# Patient Record
Sex: Female | Born: 1975 | Race: Black or African American | Hispanic: No | Marital: Married | Smoking: Never smoker
Health system: Southern US, Community
[De-identification: ages and names within clinical notes are randomized; demographics above are authoritative.]

## PROBLEM LIST (undated history)

## (undated) DIAGNOSIS — C801 Malignant (primary) neoplasm, unspecified: Secondary | ICD-10-CM

## (undated) DIAGNOSIS — Z8041 Family history of malignant neoplasm of ovary: Secondary | ICD-10-CM

## (undated) DIAGNOSIS — E119 Type 2 diabetes mellitus without complications: Secondary | ICD-10-CM

## (undated) DIAGNOSIS — J302 Other seasonal allergic rhinitis: Secondary | ICD-10-CM

## (undated) DIAGNOSIS — F419 Anxiety disorder, unspecified: Secondary | ICD-10-CM

## (undated) HISTORY — PX: TUBAL LIGATION: SHX77

## (undated) HISTORY — PX: COLPOSCOPY: SHX161

## (undated) HISTORY — DX: Type 2 diabetes mellitus without complications: E11.9

## (undated) HISTORY — DX: Family history of malignant neoplasm of ovary: Z80.41

---

## 1998-04-11 ENCOUNTER — Encounter: Admission: RE | Admit: 1998-04-11 | Discharge: 1998-04-11 | Payer: Self-pay | Admitting: *Deleted

## 1998-08-20 ENCOUNTER — Other Ambulatory Visit: Admission: RE | Admit: 1998-08-20 | Discharge: 1998-08-20 | Payer: Self-pay | Admitting: Gynecology

## 1999-06-08 ENCOUNTER — Emergency Department (HOSPITAL_COMMUNITY): Admission: EM | Admit: 1999-06-08 | Discharge: 1999-06-08 | Payer: Self-pay | Admitting: Emergency Medicine

## 1999-09-02 ENCOUNTER — Other Ambulatory Visit: Admission: RE | Admit: 1999-09-02 | Discharge: 1999-09-02 | Payer: Self-pay | Admitting: Gynecology

## 2000-09-20 ENCOUNTER — Other Ambulatory Visit: Admission: RE | Admit: 2000-09-20 | Discharge: 2000-09-20 | Payer: Self-pay | Admitting: Gynecology

## 2001-10-13 ENCOUNTER — Other Ambulatory Visit: Admission: RE | Admit: 2001-10-13 | Discharge: 2001-10-13 | Payer: Self-pay | Admitting: Gynecology

## 2002-10-17 ENCOUNTER — Other Ambulatory Visit: Admission: RE | Admit: 2002-10-17 | Discharge: 2002-10-17 | Payer: Self-pay | Admitting: Gynecology

## 2004-01-03 ENCOUNTER — Other Ambulatory Visit: Admission: RE | Admit: 2004-01-03 | Discharge: 2004-01-03 | Payer: Self-pay | Admitting: Gynecology

## 2004-08-17 HISTORY — PX: HEMATOMA EVACUATION: SHX5118

## 2004-08-22 ENCOUNTER — Encounter: Admission: RE | Admit: 2004-08-22 | Discharge: 2004-08-22 | Payer: Self-pay | Admitting: General Surgery

## 2004-09-11 ENCOUNTER — Encounter (INDEPENDENT_AMBULATORY_CARE_PROVIDER_SITE_OTHER): Payer: Self-pay | Admitting: *Deleted

## 2004-09-11 ENCOUNTER — Ambulatory Visit (HOSPITAL_BASED_OUTPATIENT_CLINIC_OR_DEPARTMENT_OTHER): Admission: RE | Admit: 2004-09-11 | Discharge: 2004-09-11 | Payer: Self-pay | Admitting: General Surgery

## 2005-01-15 ENCOUNTER — Other Ambulatory Visit: Admission: RE | Admit: 2005-01-15 | Discharge: 2005-01-15 | Payer: Self-pay | Admitting: Gynecology

## 2005-06-05 ENCOUNTER — Other Ambulatory Visit: Admission: RE | Admit: 2005-06-05 | Discharge: 2005-06-05 | Payer: Self-pay | Admitting: Gynecology

## 2005-07-10 ENCOUNTER — Inpatient Hospital Stay (HOSPITAL_COMMUNITY): Admission: AD | Admit: 2005-07-10 | Discharge: 2005-07-10 | Payer: Self-pay | Admitting: Gynecology

## 2005-10-12 ENCOUNTER — Encounter: Admission: RE | Admit: 2005-10-12 | Discharge: 2005-10-12 | Payer: Self-pay | Admitting: Gynecology

## 2005-11-23 ENCOUNTER — Ambulatory Visit: Payer: Self-pay | Admitting: Obstetrics and Gynecology

## 2005-11-23 ENCOUNTER — Ambulatory Visit (HOSPITAL_COMMUNITY): Admission: RE | Admit: 2005-11-23 | Discharge: 2005-11-23 | Payer: Self-pay | Admitting: Gynecology

## 2005-12-11 ENCOUNTER — Inpatient Hospital Stay (HOSPITAL_COMMUNITY): Admission: AD | Admit: 2005-12-11 | Discharge: 2005-12-14 | Payer: Self-pay | Admitting: Gynecology

## 2005-12-11 ENCOUNTER — Encounter (INDEPENDENT_AMBULATORY_CARE_PROVIDER_SITE_OTHER): Payer: Self-pay | Admitting: Specialist

## 2005-12-17 ENCOUNTER — Inpatient Hospital Stay (HOSPITAL_COMMUNITY): Admission: AD | Admit: 2005-12-17 | Discharge: 2005-12-19 | Payer: Self-pay | Admitting: Gynecology

## 2006-01-01 ENCOUNTER — Ambulatory Visit: Admission: RE | Admit: 2006-01-01 | Discharge: 2006-01-01 | Payer: Self-pay | Admitting: Gynecology

## 2006-01-07 ENCOUNTER — Other Ambulatory Visit: Admission: RE | Admit: 2006-01-07 | Discharge: 2006-01-07 | Payer: Self-pay | Admitting: Gynecology

## 2007-02-16 ENCOUNTER — Other Ambulatory Visit: Admission: RE | Admit: 2007-02-16 | Discharge: 2007-02-16 | Payer: Self-pay | Admitting: Gynecology

## 2007-03-20 ENCOUNTER — Emergency Department (HOSPITAL_COMMUNITY): Admission: EM | Admit: 2007-03-20 | Discharge: 2007-03-20 | Payer: Self-pay | Admitting: *Deleted

## 2008-02-22 ENCOUNTER — Other Ambulatory Visit: Admission: RE | Admit: 2008-02-22 | Discharge: 2008-02-22 | Payer: Self-pay | Admitting: Gynecology

## 2009-03-15 ENCOUNTER — Other Ambulatory Visit: Admission: RE | Admit: 2009-03-15 | Discharge: 2009-03-15 | Payer: Self-pay | Admitting: Obstetrics and Gynecology

## 2009-03-15 ENCOUNTER — Encounter: Payer: Self-pay | Admitting: Women's Health

## 2009-03-15 ENCOUNTER — Ambulatory Visit: Payer: Self-pay | Admitting: Women's Health

## 2009-04-02 ENCOUNTER — Ambulatory Visit: Payer: Self-pay | Admitting: Gynecology

## 2009-07-10 ENCOUNTER — Other Ambulatory Visit: Admission: RE | Admit: 2009-07-10 | Discharge: 2009-07-10 | Payer: Self-pay | Admitting: Gynecology

## 2009-07-10 ENCOUNTER — Ambulatory Visit: Payer: Self-pay | Admitting: Gynecology

## 2009-07-18 ENCOUNTER — Ambulatory Visit: Payer: Self-pay | Admitting: Gynecology

## 2009-10-21 ENCOUNTER — Ambulatory Visit: Payer: Self-pay | Admitting: Gynecology

## 2009-10-21 ENCOUNTER — Other Ambulatory Visit: Admission: RE | Admit: 2009-10-21 | Discharge: 2009-10-21 | Payer: Self-pay | Admitting: Gynecology

## 2009-12-24 ENCOUNTER — Ambulatory Visit: Payer: Self-pay | Admitting: Gynecology

## 2009-12-27 ENCOUNTER — Ambulatory Visit: Payer: Self-pay | Admitting: Gynecology

## 2010-01-06 ENCOUNTER — Ambulatory Visit: Payer: Self-pay | Admitting: Gynecology

## 2010-07-08 ENCOUNTER — Inpatient Hospital Stay (HOSPITAL_COMMUNITY)
Admission: AD | Admit: 2010-07-08 | Discharge: 2010-07-08 | Payer: Self-pay | Source: Home / Self Care | Admitting: Obstetrics & Gynecology

## 2010-08-27 ENCOUNTER — Encounter (INDEPENDENT_AMBULATORY_CARE_PROVIDER_SITE_OTHER): Payer: Self-pay | Admitting: Obstetrics and Gynecology

## 2010-08-27 ENCOUNTER — Inpatient Hospital Stay (HOSPITAL_COMMUNITY)
Admission: RE | Admit: 2010-08-27 | Discharge: 2010-08-29 | Payer: Self-pay | Source: Home / Self Care | Attending: Obstetrics and Gynecology | Admitting: Obstetrics and Gynecology

## 2010-09-01 LAB — COMPREHENSIVE METABOLIC PANEL
ALT: 16 U/L (ref 0–35)
AST: 21 U/L (ref 0–37)
Albumin: 2.6 g/dL — ABNORMAL LOW (ref 3.5–5.2)
Alkaline Phosphatase: 126 U/L — ABNORMAL HIGH (ref 39–117)
BUN: 3 mg/dL — ABNORMAL LOW (ref 6–23)
CO2: 24 mEq/L (ref 19–32)
Calcium: 8.5 mg/dL (ref 8.4–10.5)
Chloride: 105 mEq/L (ref 96–112)
Creatinine, Ser: 0.69 mg/dL (ref 0.4–1.2)
GFR calc Af Amer: >60 mL/min (ref >60)
GFR calc non Af Amer: >60 mL/min (ref >60)
Glucose, Bld: 82 mg/dL (ref 70–99)
Potassium: 3.8 mEq/L (ref 3.5–5.1)
Sodium: 136 mEq/L (ref 135–145)
Total Bilirubin: 0.4 mg/dL (ref 0.3–1.2)
Total Protein: 5.4 g/dL — ABNORMAL LOW (ref 6.0–8.3)

## 2010-09-01 LAB — CBC
HCT: 41.2 % (ref 36.0–46.0)
HCT: 33.7 % — ABNORMAL LOW (ref 36.0–46.0)
HCT: 31 % — ABNORMAL LOW (ref 36.0–46.0)
Hemoglobin: 13.8 g/dL (ref 12.0–15.0)
Hemoglobin: 11.1 g/dL — ABNORMAL LOW (ref 12.0–15.0)
Hemoglobin: 10.2 g/dL — ABNORMAL LOW (ref 12.0–15.0)
MCH: 28.2 pg (ref 26.0–34.0)
MCH: 27.2 pg (ref 26.0–34.0)
MCH: 27.3 pg (ref 26.0–34.0)
MCHC: 33.5 g/dL (ref 30.0–36.0)
MCHC: 32.9 g/dL (ref 30.0–36.0)
MCHC: 32.9 g/dL (ref 30.0–36.0)
MCV: 84.1 fL (ref 78.0–100.0)
MCV: 82.6 fL (ref 78.0–100.0)
MCV: 83.1 fL (ref 78.0–100.0)
Platelets: 199 10*3/uL (ref 150–400)
Platelets: 191 10*3/uL (ref 150–400)
Platelets: 194 10*3/uL (ref 150–400)
RBC: 4.9 MIL/uL (ref 3.87–5.11)
RBC: 4.08 MIL/uL (ref 3.87–5.11)
RBC: 3.73 MIL/uL — ABNORMAL LOW (ref 3.87–5.11)
RDW: 13.9 % (ref 11.5–15.5)
RDW: 13.7 % (ref 11.5–15.5)
RDW: 14.1 % (ref 11.5–15.5)
WBC: 9.8 10*3/uL (ref 4.0–10.5)
WBC: 12.4 10*3/uL — ABNORMAL HIGH (ref 4.0–10.5)
WBC: 10.6 10*3/uL — ABNORMAL HIGH (ref 4.0–10.5)

## 2010-09-01 LAB — RPR: RPR Ser Ql: NONREACTIVE

## 2010-09-01 LAB — SURGICAL PCR SCREEN
MRSA, PCR: NEGATIVE
Staphylococcus aureus: NEGATIVE

## 2010-09-01 LAB — URIC ACID: Uric Acid, Serum: 5.6 mg/dL (ref 2.4–7.0)

## 2010-09-01 LAB — LACTATE DEHYDROGENASE: LDH: 134 U/L (ref 94–250)

## 2010-09-17 NOTE — Discharge Summary (Signed)
[name], [name]                 ACCOUNT NO.:  [id number]  MEDICAL RECORD NO.:  [id number]          PATIENT TYPE:  INP  LOCATION:  9130                          [facility]ACILITY:  [facility]  PHYSICIAN:  Zelphia Cairo, MD    DATE OF BIRTH:  [birth year]  DATE OF ADMISSION:  08/27/2010 DATE OF DISCHARGE:  08/29/2010                              DISCHARGE SUMMARY   ADMITTING DIAGNOSES: 1. Intrauterine pregnancy at term. 2. Previous cesarean section, desires repeat. 3. Multiparity, desires permanent sterilization.  DISCHARGE DIAGNOSES: 1. Status post low transverse cesarean section. 2. Viable female infant.  PROCEDURE: 1. Repeat low transverse cesarean section.            2. Bilateral tubal ligation  REASON FOR ADMISSION:  Please see dictated H and P.  HOSPITAL COURSE:  The patient is a 35 year old African American married female gravida 2, para 1, that was admitted to [facility] for scheduled cesarean section.  The patient had had a previous cesarean delivery, desired repeat.  Due to multiparity, patient also requested permanent sterilization.  On the morning of admission, patient was taken to operating room where spinal anesthesia was administered without difficulty.  Low transverse incision was made with delivery of viable female infant weighing 8 pounds 11 ounces with Apgars of 9 at 1 minute and 9 at 5 minutes.  The patient tolerated procedure, was taken to the recovery room in stable condition.  On postoperative day #1, the patient was without complaint.  Vital signs were stable.  Blood pressure was noted to be elevated at 120/81 and 136/90.  The patient did have a history of previous PIH with first pregnancy.  Abdomen soft with good return of bowel function.  Fundus was firm and nontender.  Abdominal dressing noted be clean, dry, and intact.  Deep tendon reflexes were 1+. No clonus.  Foley had been discontinued.  She is voiding well.  Laboratory findings revealed  hemoglobin of 11.1 and platelet count of 191,000.  Blood type is known to be O positive.  PIH labs were drawn for the following morning.  On postoperative day #2, the patient was without complaint.  She did desire early discharge.  Vital signs were stable. Blood pressures were 110 over 73 to 113 over 74.  Abdomen soft.  Fundus firm and nontender.  Incision was clean, dry, and intact.  Laboratory findings reveled hemoglobin stable at 10.2, platelet count 194,000. Liver function tests revealed AST of 21, ALT of 16.  DISCHARGE INSTRUCTIONS:  Reviewed and patient was later discharged home.  CONDITION ON DISCHARGE:  Stable.  DIET:  Regular as tolerated.  ACTIVITY:  No heavy lifting, no driving x2 weeks, no vaginal entry.  FOLLOWUP:  Patient to follow up in the office in 1 week for an incision check and blood pressure check.  She is to call for temperature greater than 100 degrees, persistent nausea, vomiting, heavy vaginal bleeding, and/or redness or drainage from the incisional site.  The patient was also encouraged to call for headache, blurred vision, right upper quadrant pain.  DISCHARGE MEDICATIONS: 1. Tylox #30 one p.o. every 4-6 hours p.r.n. 2.  Motrin 600 mg every 6 hours. 3. Prenatal vitamins one p.o. daily.     Julio Sicks, N.P.   ______________________________ Zelphia Cairo, MD    CC/MEDQ  D:  08/29/2010  T:  08/29/2010  Job:  [id number]  Electronically Signed by Julio Sicks N.P. on 09/01/2010 08:37:15 AM Electronically Signed by Zelphia Cairo MD on 09/17/2010 08:11:35 PM

## 2010-09-19 NOTE — H&P (Signed)
  NAMEJAILYNN, [name] NO.:  [id number]  MEDICAL RECORD NO.:  [id number]         PATIENT TYPE:  WINP  LOCATION:  130                           [facility]ILITY:  [facility]  PHYSICIAN:  Juluis Mire, M.D.   DATE OF BIRTH:  May 10, 1976  DATE OF ADMISSION:  08/27/2010 DATE OF DISCHARGE:                             HISTORY & PHYSICAL   HISTORY OF PRESENT ILLNESS:  The patient is a 34 year old gravida 2, para 1, abortus 0, estimated date of confinement of August 26, 2010, by ultrasound evaluation given estimated gestational age of [redacted] weeks. Prenatal course has been complicated by prior cesarean section done for induction secondary to pregnancy-induced hypertension.  She had desires of an attempt at vaginal delivery.  However, we are at term with a long closed cervix and some elevated blood pressure.  In view of this, she decided to proceed with repeat cesarean section and desires a permanent sterilization.  Her last pregnancy was also complicated by gestational diabetes requiring insulin this pregnancy.  Her glucose screen has been negative.  In terms of allergies, no known drug allergies.  MEDICATIONS:  Prenatal vitamin, uses albuterol inhaler.  For past medical history, family history, and social history, please see prenatal records.  REVIEW OF SYSTEMS:  Noncontributory.  PHYSICAL EXAMINATION:  VITAL SIGNS:  Blood pressure 130/90.  Other vital signs are stable. HEENT:  The patient is normocephalic.  Pupils equal, round, reactive to light and accommodation.  Extraocular movements are intact.  Sclerae and conjunctivae are clear.  Oropharynx clear. NECK:  Without thyromegaly. BREASTS:  Not examined. LUNGS:  Clear. CARDIOVASCULAR:  Regular rhythm and rate without murmurs or gallops. ABDOMEN:  Gravid uterus consistent with dates. PELVIC:  The cervix is extremely long, closed.  Vertex pleasant. EXTREMITIES:  Trace edema. NEUROLOGICAL:  Grossly within normal  limits.  IMPRESSION: 1. Intrauterine pregnancy at term with unfavorable cervix. 2. Prior cesarean section, now desires a repeat. 3. Multiparity, desires sterility. 4. Asthma. 5. Some mild elevation of blood pressure plan and management.  The     patient to undergo repeat cesarean section with bilateral tubal     ligation.  The potential irreversibility of sterilization is     discussed.  Failure rates of 1 in 200 cases.  Failures can be in     the form of ectopic pregnancy requiring further surgical     management.  Risk of cesarean section explained, including the risk     of infection.  Risk of hemorrhage that could require transfusion,     risk of AIDS, or hepatitis.  Excessive bleeding could require     hysterectomy.  Risk of injury to adjacent organs including bladder,     bowel, ureters that could require further exploratory surgery.     Risk of deep venous thrombosis and pulmonary embolus.  The patient     does understand potential risks and complications.     Juluis Mire, M.D.     JSM/MEDQ  D:  08/27/2010  T:  08/27/2010  Job:  [id number]  Electronically Signed by Richardean Chimera M.D. on 09/19/2010 01:35:04 PM

## 2010-09-19 NOTE — Op Note (Signed)
[name], [name]                 ACCOUNT NO.:  [id number]  MEDICAL RECORD NO.:  [id number]          PATIENT TYPE:  INP  LOCATION:  9130                          [facility]ILITY[facility]  [facility]  PHYSICIAN:  Juluis Mire, M.D.   DATE OF BIRTH:  24-Jul-1976  DATE OF PROCEDURE:  08/27/2010 DATE OF DISCHARGE:                              OPERATIVE REPORT   PREOPERATIVE DIAGNOSES: 1. Intrauterine pregnancy at term with prior cesarean section, desires     a repeat. 2. Multiparity, desires sterility.  POSTOPERATIVE DIAGNOSES: 1. Intrauterine pregnancy at term with prior cesarean section, desires     a repeat. 2. Multiparity, desires sterility.  PROCEDURE:  Repeat low transverse cesarean section with bilateral tubal ligation.  SURGEON:  Juluis Mire, MD  ANESTHESIA:  Spinal.  ESTIMATED BLOOD LOSS:  800 mL.  PACKS AND DRAINS:  None.  INTRAOPERATIVE BLOOD PLACED:  None.  COMPLICATIONS:  None.  INDICATIONS:  Are dictated in history and physical. PROCEDURE IN DETAIL:  The patient taken to OR, placed in supine position with left lateral tilt.  After satisfactory level of spinal anesthesia obtained, the abdomen was prepped out with Betadine and draped in sterile field.  A low transverse skin incision was made and the previous incision was excised.  Excision was extended through subcutaneous tissue.  Fascia was entered sharply and the incision in the fascia was extended laterally.  Fascia taken off the muscle superiorly and inferiorly.  Rectus muscles were separated in midline.  Peritoneum was entered sharply.  Incision of the peritoneum extended both superiorly and inferiorly.  Low transverse bladder flap was developed.  Low transverse uterine incision was begun with knife, extended laterally using manual traction.  The infant presented in vertex presentation, delivered with elevation of head and fundal pressure.  The infant was a viable female weighing 8 pounds and 11 ounces. Apgars were 9/9.   Umbilical artery pH was 7.32.  Placenta was then delivered manually.  Uterus exteriorized for closure.  Uterus closed with interlocking suture of 0 chromic using a two-layer closure technique.  Tubes and ovaries unremarkable.  Midsegment of each tube was elevated with a Babcock tenaculum.  A hole was made in the avascular of the mesosalpinx.  Individual ligatures of 0 plain catgut were used to ligate off the segment and tube.  The intervening segment tube was then excised.  Cut ends of the tubes were cauterized using the Bovie.  Tubes were sent for Pathology.  Uterus was returned to the abdominal cavity.  We had some oozing from the lower uterine segment, brought under control figure-of-eight of 0 chromic. With this, we had good hemostasis and clear urine output.  We irrigated the pelvis.  We again had good clear urine output and no active bleeding.  Muscles and peritoneum closed with a suture of 3-0 Vicryl. Fascia closed with a suture of 0 PDS.  Adipose level layer was closed with interrupted sutures of 0 plain catgut.  Skin was closed with a running subcuticular 4-0 Monocryl.  Steri-Strips were applied.  At this point in time, sponge instrument and needle count was correct by circulating nurse  x2.  Foley catheter remained clear at the time of closure.  The patient tolerated procedure well was returned to recovery room in good condition.     Juluis Mire, M.D.     JSM/MEDQ  D:  08/27/2010  T:  08/28/2010  Job:  [id number]  Electronically Signed by Richardean Chimera M.D. on 09/19/2010 01:35:08 PM

## 2010-10-28 LAB — URINALYSIS, ROUTINE W REFLEX MICROSCOPIC
Bilirubin Urine: NEGATIVE
Glucose, UA: NEGATIVE mg/dL
Hgb urine dipstick: NEGATIVE
Ketones, ur: NEGATIVE mg/dL
Nitrite: NEGATIVE
Protein, ur: NEGATIVE mg/dL
Specific Gravity, Urine: 1.015 (ref 1.005–1.030)
Urobilinogen, UA: 0.2 mg/dL (ref 0.0–1.0)
pH: 6.5 (ref 5.0–8.0)

## 2010-10-28 LAB — COMPREHENSIVE METABOLIC PANEL
ALT: 13 U/L (ref 0–35)
AST: 16 U/L (ref 0–37)
Albumin: 3 g/dL — ABNORMAL LOW (ref 3.5–5.2)
Alkaline Phosphatase: 118 U/L — ABNORMAL HIGH (ref 39–117)
BUN: 6 mg/dL (ref 6–23)
CO2: 23 mEq/L (ref 19–32)
Calcium: 9.4 mg/dL (ref 8.4–10.5)
Chloride: 104 mEq/L (ref 96–112)
Creatinine, Ser: 0.66 mg/dL (ref 0.4–1.2)
GFR calc Af Amer: >60 mL/min (ref >60)
GFR calc non Af Amer: >60 mL/min (ref >60)
Glucose, Bld: 113 mg/dL — ABNORMAL HIGH (ref 70–99)
Potassium: 3.9 mEq/L (ref 3.5–5.1)
Sodium: 135 mEq/L (ref 135–145)
Total Bilirubin: 0.6 mg/dL (ref 0.3–1.2)
Total Protein: 6.4 g/dL (ref 6.0–8.3)

## 2010-10-28 LAB — CBC
HCT: 37.5 % (ref 36.0–46.0)
Hemoglobin: 12.4 g/dL (ref 12.0–15.0)
MCH: 28.4 pg (ref 26.0–34.0)
MCHC: 33 g/dL (ref 30.0–36.0)
MCV: 86.1 fL (ref 78.0–100.0)
Platelets: 235 10*3/uL (ref 150–400)
RBC: 4.35 MIL/uL (ref 3.87–5.11)
RDW: 13.8 % (ref 11.5–15.5)
WBC: 11.8 10*3/uL — ABNORMAL HIGH (ref 4.0–10.5)

## 2010-10-28 LAB — URIC ACID: Uric Acid, Serum: 4.7 mg/dL (ref 2.4–7.0)

## 2011-01-02 NOTE — Op Note (Signed)
NAMEDERRISHA, FOOS                 ACCOUNT NO.:  [id number]   MEDICAL RECORD NO.:  [id number]          PATIENT TYPE:  AMB   LOCATION:  DSC                          FACILITY:  [facility]   PHYSICIAN:  Anselm Pancoast. 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  This is following a fall last summer.   OPERATION:  Excision of large area, about a 4-cm mass, probably fat  necrosis.   ANESTHESIA:  General.   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  This improved but was  black and blue for several weeks.  The area, however, has persisted with a  mass in the subcutaneous area that measured greater than 4 inches in size.  I saw her after she was referred from a [facility].  An x-ray of the bone had  been obtained, and this was unremarkable.  I saw her in the office, and on  exam she had a definite firm mass that was in the fatty tissue that  certainly was abnormal and probably was fat necrosis.  It measured about 4 x  5 inches in greatest size.  I discussed with her that I would think we ought  to do a CT and possibly do a core biopsy.  If it was benign, then we could  excise the area.   She had the CT, and that was done about 2 weeks later.  The CT shows the  mass in the subcutaneous tissue and is thought most likely fat necrosis.  I  saw her back in the office, and the area had decreased in size but still was  a significant firm mass, and she wanted to proceed on with excision of the  area.  She was not interested in the core biopsy, and I did not think it was  necessary since the mass was decreased with history of trauma, and the x-ray  was consistent with fat  necrosis.   The patient was scheduled for today and preoperatively was given Kefzol 1 g  and then was taken to the operative suite.  The mass had been marked, and  she was positioned on the OR table with induction of general anesthesia, and  then the left hip was placed over a small pad to elevate her, and then we  flexed the knee, and held this in place with a pillow so that it would  expose the area so I could go a little more posterior since it is on the  lateral aspect of the thigh.  The area was prepped with Betadine solution  and draped in a sterile manner.  I had marked the area circumferentially,  and I made a transverse incision since the mass was more transverse and then  elevated the skin which was  very adherent to the underlying mass right at  the center area, and this looks grossly like fat necrosis to me.  We then  worked circumferentially going gradually deeper and then staying close to  the mass but completely excising it.  After we were nearly down to the  fascia, we could go medially, and then the mass was completely excised.  There were numerous little blood vessels that required suturing with 4-0  Vicryl, and we were careful with the hemostasis.  Numerous little capillary  areas were coagulated.  Anything that was an identifiable vessel was sutured  with a 4-0 Vicryl, and then I sent the mass over for pathology to examine  it.  They did a frozen, and they think it is fat necrosis but said it has  more neovascularity than they can be sure, and they want to wait until the  permanents before making a definite call.  The little giblets of fatty  tissue that were excised after the main mass were sent on up for permanent  exam also.   The skin and subcutaneous tissue will not come together because of the size  of the defect, and I elected to basically close with some subcuticular 4-0  Vicryl in mattress alternating with simple sutures on the skin, and I did  put a 10 Blake  drain in the wound and brought it out inferolaterally.  The  skin sits down nicely, and then the Vaseline gauze was placed over this, and  the drain had been sutured to the skin.  I will keep the drain in until  early next week and then remove it in the office.  The patient tolerated the  procedure nicely and was extubated and sent to the recovery room in a stable  postoperative condition.      WJW/MEDQ  D:  09/11/2004  T:  09/11/2004  Job:  [id number]

## 2011-01-02 NOTE — Discharge Summary (Signed)
NAMEELDINE, RENCHER                 ACCOUNT NO.:  [id number]   MEDICAL RECORD NO.:  [id number]          PATIENT TYPE:  INP   LOCATION:                                [facility]   PHYSICIAN:  Timothy P. Fontaine, M.D.DATE OF BIRTH:  12/17/1975   DATE OF ADMISSION:  12/11/2005  DATE OF DISCHARGE:  12/14/2005                                 DISCHARGE SUMMARY   DISCHARGE DIAGNOSES:  1.  Pregnancy at 38 weeks.  2.  Gestational diabetes, insulin-requiring.  3.  Preeclampsia.  4.  Unfavorable cervix.   PROCEDURE:  Primary low transverse cesarean section December 11, 2005, Dr.  Colin Broach.   HOSPITAL COURSE:  35 year old G1, P0 female [redacted] weeks gestation followed for  gestational diabetes.  Evaluation in the office revealed elevated blood  pressures in the 150-160/90-100 range and proteinuria.  She was at 3+  pretibial edema, normal reflexes.  She was without signs or symptoms such as  headache, visual changes, epigastric pain.  She initially was admitted for  blood pressure rechecks and PIH laboratories.  Her PIH laboratories returned  normal but serial blood pressures remained elevated.  Her cervix was long,  closed, high, ballottable, unengaged.  The options for the situation were  reviewed with her and we elected for a primary cesarean section.  The  patient underwent an uncomplicated primary cesarean section April 27  producing a normal female infant, Apgars 8 and 9, weight 6 pounds 6 ounces.  Patient was begun on magnesium sulfate prophylaxis postpartum and initially  continued with elevated blood pressures in the 140s-160s/90s.  Patient  subsequently began diuresing spontaneously.  She was begun on low-dose  labetalol 100 mg b.i.d. and subsequently did well having her magnesium  discontinued after 36 hours.  Her blood pressures after initiation of her  labetalol were in the 130-140/80-90 range.  Her postoperative hemoglobin was  11.3.  Her blood type is O+.  Her rubella titer is  positive.  The patient  desired discharge postoperative day #3.  She was ambulating well, tolerating  a regular diet, voiding without difficulty, and having had a bowel movement.  The patient was given a prescription for Tylox #30 one to two p.o. q.4-6h.,  labetalol 100 mg b.i.d. to be continued postpartum.  She was instructed for  signs and symptoms of worsening blood pressure such as headaches, visual  changes, epigastric pain for ASAP calls.  She also was instructed to follow  her blood pressure serially at home and she agreed to arrange for this.  Patient will be seen in four weeks following discharge, sooner as needed.      Timothy P. Fontaine, M.D.  Electronically Signed     TPF/MEDQ  D:  12/14/2005  T:  12/14/2005  Job:  [id number]

## 2011-01-02 NOTE — H&P (Signed)
[name], [name]                 ACCOUNT NO.:  [id number]   MEDICAL RECORD NO.:  [id number]          PATIENT TYPE:  INP   LOCATION:  9198                          [facility]   PHYSICIAN:  Timothy P. Fontaine, M.D.DATE OF BIRTH:  Sep 11, 1975   DATE OF ADMISSION:  12/11/2005  DATE OF DISCHARGE:                                HISTORY & PHYSICAL   CHIEF COMPLAINT:  1.  Pregnancy at [redacted] weeks gestation.  2.  Gestational diabetes, insulin-requiring.  3.  Preeclampsia.  4.  Unfavorable cervix.   HISTORY OF PRESENT ILLNESS:  This 35 year old G1, T0 female at [redacted] weeks  gestation being followed for gestational diabetes requiring insulin in the  p.m., was evaluated in the office, found to have elevated blood pressures in  the 130 to 140/90 range and 3 to 4+ proteinuria.  She was sent to maternity  admission triage area for continued surveillance and PIH labs.  Her medical  history again is significant for gestation diabetes requiring insulin in the  p.m. with good glucose control.   ALLERGIES:  Tobramycin, dexamethasone eye drops.   REVIEW OF SYSTEMS:  Noncontributory.   FAMILY HISTORY:  Noncontributory.   SOCIAL HISTORY:  Noncontributory.   PHYSICAL EXAMINATION:  VITAL SIGNS:  Blood pressure serial checks 157/108,  161/109, 164/98, 165/111, afebrile. Vital signs otherwise stable, heart rate  of 83, respirations 20.  HEENT:  Normal.  LUNGS:  Clear.  CARDIAC:  Regular rate, no rubs, murmurs or  gallops.  ABDOMEN:  Gravid.  Fundus consistent with term.  External monitor shows  reactive  fetal tracing without regular contractions.  PELVIC EXAM:  Cervix is long, closed, high, difficult to reach.  EXTREMITIES: There is 2 to 3+ pitting lower tibial edema.  Reflexes are 1 to  2+, negative clonus.   ASSESSMENT AND PLAN:  This is a 35 year old G1, P0, 38 weeks followed for  gestational diabetes with PIH over the latter part of her pregnancy with  blood pressures in the 130/90 range now  with preeclampsia as evidenced by  proteinuria and elevated blood pressures.  Ultrasound in the office today  showed a normal AFI with an estimated fetal weight approximately three week  lag behind gestational age.  Cervix is extremely unfavorable.  Options and  issues were reviewed with the patient and her husband to include  observation, continuing monitoring, magnesium sulfate, attempt at serial  induction versus primary cesarean section.  Given the total picture, I think  the most prudent course is to proceed with cesarean section and I reviewed  with the patient what is involved with cesarean section to include the risk  of bleeding, transfusion, infection, wound complications requiring opening  and draining of incisions, closure with secondary intention.  The  possibilities for prolonged antibiotics, reoperation for abscess drainage as  well as the risk of inadvertant injury to internal organs including bowel,  bladder, ureters, vessels and nerves necessitating major exploratory  reparative surgeries, and future reparative surgeries including ostomy  formation.  The risk of fetal injury during the birthing process,  musculoskeletal, scalpel, neuro injuries were all  discussed, understood and  accepted.  The patient's questions were answered very satisfactorily and she  is ready to proceed with surgery.      Timothy P. Fontaine, M.D.  Electronically Signed     TPF/MEDQ  D:  12/11/2005  T:  12/11/2005  Job:  [id number]

## 2011-01-02 NOTE — Op Note (Signed)
[name], [name]                 ACCOUNT NO.:  [id number]   MEDICAL RECORD NO.:  [id number]          PATIENT TYPE:  INP   LOCATION:  9149                          [facility]   PHYSICIAN:  Timothy P. Fontaine, M.D.DATE OF BIRTH:  [birth year]   DATE OF PROCEDURE:  12/11/2005  DATE OF DISCHARGE:                                 OPERATIVE REPORT   PREOPERATIVE DIAGNOSES:  1.  Intrauterine pregnancy at 38 weeks.  2.  Gestational diabetes, insulin-dependent.  3.  Preeclampsia.  4.  Unfavorable cervix.   POSTOPERATIVE DIAGNOSES:  1.  Intrauterine pregnancy at 38 weeks.  2.  Gestational diabetes, insulin-dependent.  3.  Preeclampsia.  4.  Unfavorable cervix.   PROCEDURE:  Primary low transverse cervical Cesarean section.   SURGEON:  Timothy P. Fontaine, M.D.   ASSISTANT:  Scrub technician.   ANESTHESIA:  Spinal.   ESTIMATED BLOOD LOSS:  Less than 500 cc.   COMPLICATIONS:  None.   SPECIMENS:  Samples of cord blood, placenta, umbilical cord.   FINDINGS:  At 93, normal female, Apgars 8 and 9, weight 6 pounds 6 ounces.  Pelvic anatomy noted to be normal.   DESCRIPTION OF PROCEDURE:  The patient was taken to the operating room and  underwent spinal anesthesia.  She was placed in the left tilt supine  position and received an abdominal preparation with Betadine solution.  The  bladder was emptied with indwelling Foley catheterization placed in sterile  technique.  The patient was draped in the usual fashion.   After assuring adequate spinal anesthesia, the abdomen was sharply entered  through a primary Pfannenstiel incision, achieving adequate hemostasis at  all levels.  The bladder flap was sharply and bluntly developed without  difficulty, and the uterus was sharply entered in the lower uterine segment  and bluntly extended laterally.  The bulging membranes were ruptured, and  the fluid was noted to be clear.  The floating vertex was delivered.  The  nares and mouth were  suctioned.  The rest of the infant was delivered.  The  cord was doubly clamped and cut, and the infant was handed to pediatrics in  attendance.  Samples of cord blood were obtained.  The placenta was then  spontaneously extruded and noted to be intact and was sent to pathology.  The patient received antibiotic prophylaxis at this time.  The uterus was  exteriorized.  The endometrial cavity was explored with a sponge to move all  placental and membrane fragments.  The uterine incision was then closed in  two layers using 0 Vicryl suture, first by a running interlocking stitch  followed by an imbricating stitch.  The uterus was returned to the abdomen  which was copiously irrigated.  Adequate hemostasis was visualized, and the  anterior fascia was reapproximated using 0 Vicryl suture in a running  stitch.  The subcutaneous tissues were irrigated.  Hemostasis was achieved  with electrocautery.  The skin was reapproximated using 4-0 Vicryl in a  running subcuticular stitch.  Steri-Strips and benzoin were applied.  A  pressure dressing was applied.   The  patient was then taken to the recovery room in good condition, having  tolerated the procedure well.      Timothy P. Fontaine, M.D.  Electronically Signed     TPF/MEDQ  D:  12/11/2005  T:  12/13/2005  Job:  [id number]

## 2011-08-13 DIAGNOSIS — J45909 Unspecified asthma, uncomplicated: Secondary | ICD-10-CM | POA: Insufficient documentation

## 2011-08-13 DIAGNOSIS — IMO0002 Reserved for concepts with insufficient information to code with codable children: Secondary | ICD-10-CM | POA: Insufficient documentation

## 2011-08-19 ENCOUNTER — Encounter: Payer: Self-pay | Admitting: Women's Health

## 2011-08-19 ENCOUNTER — Other Ambulatory Visit (HOSPITAL_COMMUNITY)
Admission: RE | Admit: 2011-08-19 | Discharge: 2011-08-19 | Disposition: A | Discharge status: Home / Self Care | Payer: BC Managed Care – PPO | Source: Ambulatory Visit | Attending: Obstetrics and Gynecology | Admitting: Obstetrics and Gynecology

## 2011-08-19 ENCOUNTER — Ambulatory Visit (INDEPENDENT_AMBULATORY_CARE_PROVIDER_SITE_OTHER): Payer: BC Managed Care – PPO | Admitting: Women's Health

## 2011-08-19 VITALS — BP 120/80 | Ht 64.25 in | Wt 223.0 lb

## 2011-08-19 DIAGNOSIS — Z23 Encounter for immunization: Secondary | ICD-10-CM

## 2011-08-19 DIAGNOSIS — Z01419 Encounter for gynecological examination (general) (routine) without abnormal findings: Secondary | ICD-10-CM

## 2011-08-19 DIAGNOSIS — Z833 Family history of diabetes mellitus: Secondary | ICD-10-CM

## 2011-08-19 DIAGNOSIS — J45909 Unspecified asthma, uncomplicated: Secondary | ICD-10-CM

## 2011-08-19 LAB — CBC WITH DIFFERENTIAL/PLATELET
Basophils Absolute: 0 10*3/uL (ref 0.0–0.1)
Basophils Relative: 0 % (ref 0–1)
Eosinophils Absolute: 0.4 10*3/uL (ref 0.0–0.7)
Eosinophils Relative: 6 % — ABNORMAL HIGH (ref 0–5)
HCT: 42.3 % (ref 36.0–46.0)
Hemoglobin: 13.4 g/dL (ref 12.0–15.0)
Lymphocytes Relative: 31 % (ref 12–46)
Lymphs Abs: 1.8 10*3/uL (ref 0.7–4.0)
MCH: 27.5 pg (ref 26.0–34.0)
MCHC: 31.7 g/dL (ref 30.0–36.0)
MCV: 86.7 fL (ref 78.0–100.0)
Monocytes Absolute: 0.5 10*3/uL (ref 0.1–1.0)
Monocytes Relative: 9 % (ref 3–12)
Neutro Abs: 3.2 10*3/uL (ref 1.7–7.7)
Neutrophils Relative %: 54 % (ref 43–77)
Platelets: 264 10*3/uL (ref 150–400)
RBC: 4.88 MIL/uL (ref 3.87–5.11)
RDW: 13 % (ref 11.5–15.5)
WBC: 5.9 10*3/uL (ref 4.0–10.5)

## 2011-08-19 LAB — GLUCOSE, RANDOM: Glucose, Bld: 75 mg/dL (ref 70–99)

## 2011-08-19 MED ORDER — ALBUTEROL SULFATE HFA 108 (90 BASE) MCG/ACT IN AERS: 2.0000 | INHALATION_SPRAY | Freq: Four times a day (QID) | RESPIRATORY_TRACT | Status: DC | PRN

## 2011-08-19 NOTE — Progress Notes (Signed)
[name] 1976/05/24 [id number]    History:    The patient presents for annual exam.  Regular monthly cycle. C-section with BTL one year ago/ [facility]. Gestational diabetes and hypertension with pregnancy, both resolved after delivery. 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   Past medical history, past surgical history, family history and social history were all reviewed and documented in the EPIC chart.   ROS:  A  ROS was performed and pertinent positives and negatives are included in the history.  Exam:  Filed Vitals:   08/19/11 0912  BP: 120/80    General appearance:  Normal Head/Neck:  Normal, without cervical or supraclavicular adenopathy. Thyroid:  Symmetrical, normal in size, without palpable masses or nodularity. Respiratory  Effort:  Normal  Auscultation:  Clear without wheezing or rhonchi Cardiovascular  Auscultation:  Regular rate, without rubs, murmurs or gallops  Edema/varicosities:  Not grossly evident Abdominal  Soft,nontender, without masses, guarding or rebound.  Liver/spleen:  No organomegaly noted  Hernia:  None appreciated  Skin  Inspection:  Grossly normal  Palpation:  Grossly normal Neurologic/psychiatric  Orientation:  Normal with appropriate conversation.  Mood/affect:  Normal  Genitourinary    Breasts: Examined lying and sitting.     Right: Without masses, retractions, discharge or axillary adenopathy.     Left: Without masses, retractions, discharge or axillary adenopathy.   Inguinal/mons:  Normal without inguinal adenopathy  External genitalia:  Normal  BUS/Urethra/Skene's glands:  Normal  Bladder:  Normal  Vagina:  Normal  Cervix:  Normal  Uterus:  normal in size, shape and contour.  Midline and mobile  Adnexa/parametria:     Rt: Without masses or tenderness.   Lt: Without masses or tenderness.  Anus and perineum: Normal  Digital rectal exam: Normal sphincter tone without palpated masses  or tenderness  Assessment/Plan:  36 y.o. MBF G2 P2 for annual exam.   Normal GYN exam Obesity Seasonal allergies/asthma  Plan: CBC, glucose, UA and Pap. SBEs, exercise, reviewed importance of weight loss in relationship to health and prevention of diabetes. Calcium rich diet encouraged. Flu shot given per request. Refill of Proventil inhaler given, uses occasionally for seasonal allergy/asthma.   MISKI, FELDPAUSCH WHNP, 10:03 AM 08/19/2011

## 2012-02-26 ENCOUNTER — Encounter: Payer: Self-pay | Admitting: Gynecology

## 2012-02-26 ENCOUNTER — Ambulatory Visit (INDEPENDENT_AMBULATORY_CARE_PROVIDER_SITE_OTHER): Payer: BC Managed Care – PPO | Admitting: Gynecology

## 2012-02-26 VITALS — BP 124/82

## 2012-02-26 DIAGNOSIS — IMO0001 Reserved for inherently not codable concepts without codable children: Secondary | ICD-10-CM

## 2012-02-26 DIAGNOSIS — N390 Urinary tract infection, site not specified: Secondary | ICD-10-CM

## 2012-02-26 DIAGNOSIS — N898 Other specified noninflammatory disorders of vagina: Secondary | ICD-10-CM

## 2012-02-26 DIAGNOSIS — R350 Frequency of micturition: Secondary | ICD-10-CM

## 2012-02-26 DIAGNOSIS — L293 Anogenital pruritus, unspecified: Secondary | ICD-10-CM

## 2012-02-26 LAB — WET PREP FOR TRICH, YEAST, CLUE
Clue Cells Wet Prep HPF POC: NONE SEEN
Trich, Wet Prep: NONE SEEN

## 2012-02-26 LAB — URINALYSIS W MICROSCOPIC + REFLEX CULTURE
Casts: NONE SEEN
Crystals: NONE SEEN
Glucose, UA: 100 mg/dL — AB
Ketones, ur: 15 mg/dL — AB
Leukocytes, UA: NEGATIVE
Nitrite: POSITIVE — AB
Protein, ur: 30 mg/dL — AB
Specific Gravity, Urine: 1.025 (ref 1.005–1.030)
Urobilinogen, UA: 1 mg/dL (ref 0.0–1.0)
pH: 5.5 (ref 5.0–8.0)

## 2012-02-26 MED ORDER — NITROFURANTOIN MONOHYD MACRO 100 MG PO CAPS: 100.0000 mg | ORAL_CAPSULE | Freq: Two times a day (BID) | ORAL | Status: AC

## 2012-02-26 MED ORDER — FLUCONAZOLE 150 MG PO TABS: 150.0000 mg | ORAL_TABLET | Freq: Once | ORAL | Status: AC

## 2012-02-26 NOTE — Patient Instructions (Signed)

## 2012-02-26 NOTE — Progress Notes (Signed)
Patient a 36 year old who presented to the office today stating for the past couple of days she's had urinary frequency and irritation which she voiced no true dysuria. She's also had some vulvar pruritus but denies any discharge. She tried some anti-spasmodic agent over-the-counter for today's the symptoms is still present. She denies any fever chills nausea or vomiting.  Exam: Abdomen: Soft nontender no rebound or guarding Pelvic: Large and urethra Skene was within normal limits Vagina: White discharge was noted Cervix: No lesions or discharge Uterus: Anteverted normal size shape and consistency Adnexa: No palpable masses or tenderness Rectal exam: Not done  Urinalysis with many bacteria 3-6 RBC and 0-2 WBC. Wet prep yeast was noted as well as many bacteria  Assessment/plan: Urinary tract infection with no yeast infection noted. Patient be given a prescription Diflucan 150 mg one by mouth today. And start Macrobid one by mouth twice a day for 7 days. She will be given samples of Uribell antispasmodic agent to take 1 tablet by mouth 3 times a day for the next 2 days. If she develops any fever chills nausea vomiting or severe by patient report to the office or to the emergency room visits after hours.

## 2012-02-28 LAB — URINE CULTURE

## 2012-02-29 ENCOUNTER — Other Ambulatory Visit: Payer: Self-pay | Admitting: Gynecology

## 2012-02-29 DIAGNOSIS — N390 Urinary tract infection, site not specified: Secondary | ICD-10-CM

## 2012-03-28 ENCOUNTER — Ambulatory Visit (INDEPENDENT_AMBULATORY_CARE_PROVIDER_SITE_OTHER): Payer: BC Managed Care – PPO | Admitting: Women's Health

## 2012-03-28 ENCOUNTER — Encounter: Payer: Self-pay | Admitting: Women's Health

## 2012-03-28 DIAGNOSIS — N898 Other specified noninflammatory disorders of vagina: Secondary | ICD-10-CM

## 2012-03-28 DIAGNOSIS — M549 Dorsalgia, unspecified: Secondary | ICD-10-CM

## 2012-03-28 LAB — URINALYSIS W MICROSCOPIC + REFLEX CULTURE
Glucose, UA: NEGATIVE mg/dL
Hgb urine dipstick: NEGATIVE
Protein, ur: NEGATIVE mg/dL
pH: 7 (ref 5.0–8.0)

## 2012-03-28 LAB — WET PREP FOR TRICH, YEAST, CLUE: Clue Cells Wet Prep HPF POC: NONE SEEN

## 2012-03-28 NOTE — Progress Notes (Signed)
Patient ID: [name], female   DOB: 04/01/1976, 36 y.o.   MRN: [id number] Presents with complaint of low back discomfort in the hip area bilateral. Was treated for UTI and yeast infection one month ago and wanted to make sure no further problems. Denies pain, burning or frequency with urination or vaginal discharge with itching or odor. Denies a fever. Having a regular monthly cycle/BTL.  Exam: No CVAT, abdomen soft nontender, external genitalia within normal limits, speculum exam cervix pink healthy without discharge, wet prep - negative. UA - negative.  Normal exam  Plan: Reviewed normality of UA and wet prep results. Encouraged Motrin for hip discomfort instructed to call if persists.

## 2012-07-13 ENCOUNTER — Ambulatory Visit: Payer: BC Managed Care – PPO | Admitting: Gynecology

## 2013-03-10 ENCOUNTER — Ambulatory Visit (INDEPENDENT_AMBULATORY_CARE_PROVIDER_SITE_OTHER): Payer: BC Managed Care – PPO | Admitting: Women's Health

## 2013-03-10 ENCOUNTER — Other Ambulatory Visit (HOSPITAL_COMMUNITY)
Admission: RE | Admit: 2013-03-10 | Discharge: 2013-03-10 | Disposition: A | Discharge status: Home / Self Care | Payer: BC Managed Care – PPO | Source: Ambulatory Visit | Attending: Gynecology | Admitting: Gynecology

## 2013-03-10 ENCOUNTER — Encounter: Payer: Self-pay | Admitting: Women's Health

## 2013-03-10 VITALS — BP 120/74 | Ht 65.5 in | Wt 238.0 lb

## 2013-03-10 DIAGNOSIS — Z01419 Encounter for gynecological examination (general) (routine) without abnormal findings: Secondary | ICD-10-CM

## 2013-03-10 DIAGNOSIS — Z833 Family history of diabetes mellitus: Secondary | ICD-10-CM

## 2013-03-10 LAB — CBC WITH DIFFERENTIAL/PLATELET
Basophils Relative: 0 % (ref 0–1)
Eosinophils Relative: 2 % (ref 0–5)
HCT: 38.9 % (ref 36.0–46.0)
Hemoglobin: 12.7 g/dL (ref 12.0–15.0)
MCHC: 32.6 g/dL (ref 30.0–36.0)
MCV: 82.4 fL (ref 78.0–100.0)
Monocytes Absolute: 0.5 10*3/uL (ref 0.1–1.0)
Monocytes Relative: 8 % (ref 3–12)
Neutro Abs: 3.6 10*3/uL (ref 1.7–7.7)

## 2013-03-10 NOTE — Progress Notes (Signed)
[name] [birth year] [id number]    History:    The patient presents for annual exam.  Regular monthly cycle/BTL. LGSIL 2010 with negative colposcopy, Pap normal 2011.   Past medical history, past surgical history, family history and social history were all reviewed and documented in the EPIC chart. GDM/insulin and hypertension with pregnancy. 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 Ethan 3, Evan 7 both doing well but poor sleepers. History of asthma, rare use of inhalers.   ROS:  A  ROS was performed and pertinent positives and negatives are included in the history.  Exam:  Filed Vitals:   03/10/13 1032  BP: 120/74    General appearance:  Normal Head/Neck:  Normal, without cervical or supraclavicular adenopathy. Thyroid:  Symmetrical, normal in size, without palpable masses or nodularity. Respiratory  Effort:  Normal  Auscultation:  Clear without wheezing or rhonchi Cardiovascular  Auscultation:  Regular rate, without rubs, murmurs or gallops  Edema/varicosities:  Not grossly evident Abdominal  Soft,nontender, without masses, guarding or rebound.  Liver/spleen:  No organomegaly noted  Hernia:  None appreciated  Skin  Inspection:  Grossly normal  Palpation:  Grossly normal Neurologic/psychiatric  Orientation:  Normal with appropriate conversation.  Mood/affect:  Normal  Genitourinary    Breasts: Examined lying and sitting.     Right: Without masses, retractions, discharge or axillary adenopathy.     Left: Without masses, retractions, discharge or axillary adenopathy.   Inguinal/mons:  Normal without inguinal adenopathy  External genitalia:  Normal  BUS/Urethra/Skene's glands:  Normal  Bladder:  Normal  Vagina:  Normal  Cervix:  Normal  Uterus:   normal in size, shape and contour.  Midline and mobile  Adnexa/parametria:     Rt: Without masses or tenderness.   Lt: Without masses or tenderness.  Anus and perineum: Normal  Digital rectal exam: Normal sphincter  tone without palpated masses or tenderness  Assessment/Plan:  37 y.o. MBF G2 P2 for annual exam with no complaints.  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 SBE's, increase regular exercise and decrease calories for weight loss, calcium rich diet, MVI daily encouraged.    JAMISE, [place] WHNP, 1:01 PM 03/10/2013

## 2013-03-10 NOTE — Patient Instructions (Addendum)

## 2013-03-11 LAB — URINALYSIS W MICROSCOPIC + REFLEX CULTURE
Casts: NONE SEEN
Crystals: NONE SEEN
Ketones, ur: NEGATIVE mg/dL
Leukocytes, UA: NEGATIVE
Nitrite: NEGATIVE
Specific Gravity, Urine: 1.026 (ref 1.005–1.030)
Urobilinogen, UA: 0.2 mg/dL (ref 0.0–1.0)
pH: 6.5 (ref 5.0–8.0)

## 2014-03-20 ENCOUNTER — Other Ambulatory Visit (HOSPITAL_COMMUNITY)
Admission: RE | Admit: 2014-03-20 | Discharge: 2014-03-20 | Disposition: A | Discharge status: Home / Self Care | Payer: BC Managed Care – PPO | Source: Ambulatory Visit | Attending: Gynecology | Admitting: Gynecology

## 2014-03-20 ENCOUNTER — Encounter: Payer: Self-pay | Admitting: Women's Health

## 2014-03-20 ENCOUNTER — Other Ambulatory Visit (HOSPITAL_COMMUNITY)
Admission: RE | Admit: 2014-03-20 | Discharge: 2014-03-20 | Disposition: A | Discharge status: Home / Self Care | Payer: BC Managed Care – PPO | Source: Ambulatory Visit | Attending: Women's Health | Admitting: Women's Health

## 2014-03-20 ENCOUNTER — Ambulatory Visit (INDEPENDENT_AMBULATORY_CARE_PROVIDER_SITE_OTHER): Payer: BC Managed Care – PPO | Admitting: Women's Health

## 2014-03-20 VITALS — BP 116/76 | Ht 67.0 in | Wt 247.6 lb

## 2014-03-20 DIAGNOSIS — Z13220 Encounter for screening for lipoid disorders: Secondary | ICD-10-CM

## 2014-03-20 DIAGNOSIS — Z1151 Encounter for screening for human papillomavirus (HPV): Secondary | ICD-10-CM | POA: Insufficient documentation

## 2014-03-20 DIAGNOSIS — Z01419 Encounter for gynecological examination (general) (routine) without abnormal findings: Secondary | ICD-10-CM

## 2014-03-20 LAB — COMPREHENSIVE METABOLIC PANEL
ALT: 11 U/L (ref 0–35)
AST: 10 U/L (ref 0–37)
Albumin: 4.2 g/dL (ref 3.5–5.2)
Alkaline Phosphatase: 80 U/L (ref 39–117)
BUN: 12 mg/dL (ref 6–23)
CO2: 26 mEq/L (ref 19–32)
Calcium: 9.3 mg/dL (ref 8.4–10.5)
Chloride: 103 mEq/L (ref 96–112)
Creat: 0.78 mg/dL (ref 0.50–1.10)
Glucose, Bld: 90 mg/dL (ref 70–99)
Potassium: 4.3 mEq/L (ref 3.5–5.3)
Sodium: 137 mEq/L (ref 135–145)
Total Bilirubin: 0.8 mg/dL (ref 0.2–1.2)
Total Protein: 7 g/dL (ref 6.0–8.3)

## 2014-03-20 LAB — LIPID PANEL
Cholesterol: 199 mg/dL (ref 0–200)
HDL: 54 mg/dL (ref >39)
LDL Cholesterol: 114 mg/dL — ABNORMAL HIGH (ref 0–99)
Total CHOL/HDL Ratio: 3.7 Ratio
Triglycerides: 153 mg/dL — ABNORMAL HIGH (ref <150)
VLDL: 31 mg/dL (ref 0–40)

## 2014-03-20 LAB — CBC WITH DIFFERENTIAL/PLATELET
BASOS ABS: 0 10*3/uL (ref 0.0–0.1)
BASOS PCT: 0 % (ref 0–1)
Eosinophils Absolute: 0.1 10*3/uL (ref 0.0–0.7)
Eosinophils Relative: 2 % (ref 0–5)
HCT: 39.2 % (ref 36.0–46.0)
HEMOGLOBIN: 12.9 g/dL (ref 12.0–15.0)
Lymphocytes Relative: 29 % (ref 12–46)
Lymphs Abs: 1.7 10*3/uL (ref 0.7–4.0)
MCH: 26.7 pg (ref 26.0–34.0)
MCHC: 32.9 g/dL (ref 30.0–36.0)
MCV: 81.2 fL (ref 78.0–100.0)
MONOS PCT: 5 % (ref 3–12)
Monocytes Absolute: 0.3 10*3/uL (ref 0.1–1.0)
NEUTROS ABS: 3.8 10*3/uL (ref 1.7–7.7)
NEUTROS PCT: 64 % (ref 43–77)
PLATELETS: 304 10*3/uL (ref 150–400)
RBC: 4.83 MIL/uL (ref 3.87–5.11)
RDW: 13.3 % (ref 11.5–15.5)
WBC: 6 10*3/uL (ref 4.0–10.5)

## 2014-03-20 NOTE — Patient Instructions (Signed)

## 2014-03-20 NOTE — Progress Notes (Signed)
[name] 05/30/1976 [id number]    History:    Presents for annual exam.  Regular monthly cycle/BTL. 2010 LGSIL with a negative colposcopy and biopsy. Normal Paps after. Gestational diabetes with insulin and hypertension with pregnancy.  Past medical history, past surgical history, family history and social history were all reviewed and documented in the EPIC chart. Seventh grade teacher. 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 Asthma stable.  ROS:  A  12 point ROS was performed and pertinent positives and negatives are included.  Exam:  Filed Vitals:   03/20/14 0843  BP: 116/76    General appearance:  Normal Thyroid:  Symmetrical, normal in size, without palpable masses or nodularity. Respiratory  Auscultation:  Clear without wheezing or rhonchi Cardiovascular  Auscultation:  Regular rate, without rubs, murmurs or gallops  Edema/varicosities:  Not grossly evident Abdominal  Soft,nontender, without masses, guarding or rebound.  Liver/spleen:  No organomegaly noted  Hernia:  None appreciated  Skin  Inspection:  Grossly normal   Breasts: Examined lying and sitting.     Right: Without masses, retractions, discharge or axillary adenopathy.     Left: Without masses, retractions, discharge or axillary adenopathy. Gentitourinary   Inguinal/mons:  Normal without inguinal adenopathy  External genitalia:  Normal  BUS/Urethra/Skene's glands:  Normal  Vagina:  Normal  Cervix:  Normal  Uterus:   normal in size, shape and contour.  Midline and mobile  Adnexa/parametria:     Rt: Without masses or tenderness.   Lt: Without masses or tenderness.  Anus and perineum: Normal  Digital rectal exam: Normal sphincter tone without palpated masses or tenderness  Assessment/Plan:  38 y.o. MBF G2P2 for annual exam with no complaints.  BTL/regular monthly cycle 2010 LGSIL with negative colposcopy and biopsy Obesity History GDM  - insulin  Plan: SBE's, increase regular  exercise, decrease calories for weight loss, reviewed importance of decreasing simple carbs. Vitamin D 1000 daily encouraged. CBC, comprehensive metabolic panel, lipid panel, UA, Pap with HR HPV typing. New screening guidelines reviewed.   Note: This dictation was prepared with Dragon/digital dictation.  Any transcriptional errors that result are unintentional. [name], [name] [facility], 10:05 AM 03/20/2014

## 2014-03-21 LAB — URINALYSIS W MICROSCOPIC + REFLEX CULTURE
BILIRUBIN URINE: NEGATIVE
Bacteria, UA: NONE SEEN
CRYSTALS: NONE SEEN
Casts: NONE SEEN
GLUCOSE, UA: NEGATIVE mg/dL
Hgb urine dipstick: NEGATIVE
KETONES UR: NEGATIVE mg/dL
Leukocytes, UA: NEGATIVE
Nitrite: NEGATIVE
PH: 6 (ref 5.0–8.0)
Protein, ur: NEGATIVE mg/dL
SPECIFIC GRAVITY, URINE: 1.025 (ref 1.005–1.030)
Squamous Epithelial / LPF: NONE SEEN
Urobilinogen, UA: 0.2 mg/dL (ref 0.0–1.0)

## 2014-03-22 LAB — CYTOLOGY - PAP

## 2014-04-19 ENCOUNTER — Telehealth: Payer: Self-pay | Admitting: *Deleted

## 2014-04-19 ENCOUNTER — Encounter: Payer: Self-pay | Admitting: Women's Health

## 2014-04-19 ENCOUNTER — Ambulatory Visit (INDEPENDENT_AMBULATORY_CARE_PROVIDER_SITE_OTHER): Payer: BC Managed Care – PPO | Admitting: Women's Health

## 2014-04-19 DIAGNOSIS — N63 Unspecified lump in unspecified breast: Secondary | ICD-10-CM

## 2014-04-19 DIAGNOSIS — R928 Other abnormal and inconclusive findings on diagnostic imaging of breast: Secondary | ICD-10-CM

## 2014-04-19 NOTE — Telephone Encounter (Signed)
Orders placed at breast center they will contact pt to schedule. 

## 2014-04-19 NOTE — Progress Notes (Signed)
Patient ID: [name], female   DOB: 28-Dec-1975, 38 y.o.   MRN: [id number] Presents with complaint of right breast feeling different in the nipple area with SBE's. Denies pain, nipple discharge or change in breast appearance. Does do self rest exams monthly. Denies injury, change in routine or exercise. Monthly cycle. No family history of breast cancer.  Exam: Appears worried. Breast exam and sitting in lying position without visible dimpling, erythema or skin changes. No palpable nodules. Reviewed most likely breast tissue, slightly firmer in the right breast and left.  Change in SBE  Plan: Right breast diagnostic mammogram, will schedule. Continue SBE's and report changes.

## 2014-04-19 NOTE — Telephone Encounter (Signed)
Message copied by Thamas Jaegers on Thu Apr 19, 2014 10:30 AM ------      Message from: [name], [name]      Created: Thu Apr 19, 2014  9:50 AM       Needs diagnostic mammogram of rt breast, outer aspect probable thickening, breast tissue.  Pt felt a "change"  Late day best  School teacher.   ------

## 2014-05-03 NOTE — Telephone Encounter (Signed)
Breast center tried to call pt and left message on 04/24/14, I called and left message as well asking her to please call breast center to schedule imaging

## 2014-05-22 ENCOUNTER — Encounter: Payer: Self-pay | Admitting: *Deleted

## 2014-05-22 NOTE — Telephone Encounter (Signed)
I called and left another for pt to please schedule imaging at breast center. Letter will be mailed to pt.

## 2014-06-18 ENCOUNTER — Encounter: Payer: Self-pay | Admitting: Women's Health

## 2015-02-19 ENCOUNTER — Encounter (HOSPITAL_COMMUNITY): Payer: Self-pay | Admitting: Emergency Medicine

## 2015-03-22 ENCOUNTER — Ambulatory Visit (INDEPENDENT_AMBULATORY_CARE_PROVIDER_SITE_OTHER): Payer: BC Managed Care – PPO | Admitting: Women's Health

## 2015-03-22 ENCOUNTER — Encounter: Payer: Self-pay | Admitting: Women's Health

## 2015-03-22 VITALS — BP 132/86 | Ht 65.0 in | Wt 246.0 lb

## 2015-03-22 DIAGNOSIS — Z01419 Encounter for gynecological examination (general) (routine) without abnormal findings: Secondary | ICD-10-CM | POA: Diagnosis not present

## 2015-03-22 LAB — CBC WITH DIFFERENTIAL/PLATELET
BASOS ABS: 0 10*3/uL (ref 0.0–0.1)
BASOS PCT: 0 % (ref 0–1)
EOS PCT: 2 % (ref 0–5)
Eosinophils Absolute: 0.1 10*3/uL (ref 0.0–0.7)
HEMATOCRIT: 38.6 % (ref 36.0–46.0)
HEMOGLOBIN: 12.5 g/dL (ref 12.0–15.0)
Lymphocytes Relative: 30 % (ref 12–46)
Lymphs Abs: 2 10*3/uL (ref 0.7–4.0)
MCH: 26.9 pg (ref 26.0–34.0)
MCHC: 32.4 g/dL (ref 30.0–36.0)
MCV: 83.2 fL (ref 78.0–100.0)
MPV: 9 fL (ref 8.6–12.4)
Monocytes Absolute: 0.5 10*3/uL (ref 0.1–1.0)
Monocytes Relative: 8 % (ref 3–12)
NEUTROS PCT: 60 % (ref 43–77)
Neutro Abs: 4.1 10*3/uL (ref 1.7–7.7)
Platelets: 311 10*3/uL (ref 150–400)
RBC: 4.64 MIL/uL (ref 3.87–5.11)
RDW: 13.6 % (ref 11.5–15.5)
WBC: 6.8 10*3/uL (ref 4.0–10.5)

## 2015-03-22 LAB — COMPREHENSIVE METABOLIC PANEL
ALBUMIN: 4 g/dL (ref 3.6–5.1)
ALK PHOS: 84 U/L (ref 33–115)
ALT: 12 U/L (ref 6–29)
AST: 11 U/L (ref 10–30)
BUN: 14 mg/dL (ref 7–25)
CHLORIDE: 104 mmol/L (ref 98–110)
CO2: 27 mmol/L (ref 20–31)
Calcium: 9.3 mg/dL (ref 8.6–10.2)
Creat: 0.76 mg/dL (ref 0.50–1.10)
GLUCOSE: 118 mg/dL — AB (ref 65–99)
Potassium: 3.8 mmol/L (ref 3.5–5.3)
SODIUM: 141 mmol/L (ref 135–146)
TOTAL PROTEIN: 6.9 g/dL (ref 6.1–8.1)
Total Bilirubin: 0.5 mg/dL (ref 0.2–1.2)

## 2015-03-22 LAB — LIPID PANEL
CHOL/HDL RATIO: 3.9 ratio (ref <=5.0)
Cholesterol: 201 mg/dL — ABNORMAL HIGH (ref 125–200)
HDL: 51 mg/dL (ref >=46)
LDL CALC: 107 mg/dL (ref <130)
Triglycerides: 217 mg/dL — ABNORMAL HIGH (ref <150)
VLDL: 43 mg/dL — ABNORMAL HIGH (ref <30)

## 2015-03-22 NOTE — Patient Instructions (Signed)
Health Maintenance Adopting a healthy lifestyle and getting preventive care can go a long way to promote health and wellness. Talk with your health care provider about what schedule of regular examinations is right for you. This is a good chance for you to check in with your provider about disease prevention and staying healthy. In between checkups, there are plenty of things you can do on your own. Experts have done a lot of research about which lifestyle changes and preventive measures are most likely to keep you healthy. Ask your health care provider for more information. WEIGHT AND DIET  Eat a healthy diet  Be sure to include plenty of vegetables, fruits, low-fat dairy products, and lean protein.  Do not eat a lot of foods high in solid fats, added sugars, or salt.  Get regular exercise. This is one of the most important things you can do for your health.  Most adults should exercise for at least 150 minutes each week. The exercise should increase your heart rate and make you sweat (moderate-intensity exercise).  Most adults should also do strengthening exercises at least twice a week. This is in addition to the moderate-intensity exercise.  Maintain a healthy weight  Body mass index (BMI) is a measurement that can be used to identify possible weight problems. It estimates body fat based on height and weight. Your health care provider can help determine your BMI and help you achieve or maintain a healthy weight.  For females 25 years of age and older:   A BMI below 18.5 is considered underweight.  A BMI of 18.5 to 24.9 is normal.  A BMI of 25 to 29.9 is considered overweight.  A BMI of 30 and above is considered obese.  Watch levels of cholesterol and blood lipids  You should start having your blood tested for lipids and cholesterol at 39 years of age, then have this test every 5 years.  You may need to have your cholesterol levels checked more often if:  Your lipid or  cholesterol levels are high.  You are older than 39 years of age.  You are at high risk for heart disease.  CANCER SCREENING   Lung Cancer  Lung cancer screening is recommended for adults 97-92 years old who are at high risk for lung cancer because of a history of smoking.  A yearly low-dose CT scan of the lungs is recommended for people who:  Currently smoke.  Have quit within the past 15 years.  Have at least a 30-pack-year history of smoking. A pack year is smoking an average of one pack of cigarettes a day for 1 year.  Yearly screening should continue until it has been 15 years since you quit.  Yearly screening should stop if you develop a health problem that would prevent you from having lung cancer treatment.  Breast Cancer  Practice breast self-awareness. This means understanding how your breasts normally appear and feel.  It also means doing regular breast self-exams. Let your health care provider know about any changes, no matter how small.  If you are in your 20s or 30s, you should have a clinical breast exam (CBE) by a health care provider every 1-3 years as part of a regular health exam.  If you are 76 or older, have a CBE every year. Also consider having a breast X-ray (mammogram) every year.  If you have a family history of breast cancer, talk to your health care provider about genetic screening.  If you are  at high risk for breast cancer, talk to your health care provider about having an MRI and a mammogram every year.  Breast cancer gene (BRCA) assessment is recommended for women who have family members with BRCA-related cancers. BRCA-related cancers include:  Breast.  Ovarian.  Tubal.  Peritoneal cancers.  Results of the assessment will determine the need for genetic counseling and BRCA1 and BRCA2 testing. Cervical Cancer Routine pelvic examinations to screen for cervical cancer are no longer recommended for nonpregnant women who are considered low  risk for cancer of the pelvic organs (ovaries, uterus, and vagina) and who do not have symptoms. A pelvic examination may be necessary if you have symptoms including those associated with pelvic infections. Ask your health care provider if a screening pelvic exam is right for you.   The Pap test is the screening test for cervical cancer for women who are considered at risk.  If you had a hysterectomy for a problem that was not cancer or a condition that could lead to cancer, then you no longer need Pap tests.  If you are older than 65 years, and you have had normal Pap tests for the past 10 years, you no longer need to have Pap tests.  If you have had past treatment for cervical cancer or a condition that could lead to cancer, you need Pap tests and screening for cancer for at least 20 years after your treatment.  If you no longer get a Pap test, assess your risk factors if they change (such as having a new sexual partner). This can affect whether you should start being screened again.  Some women have medical problems that increase their chance of getting cervical cancer. If this is the case for you, your health care provider may recommend more frequent screening and Pap tests.  The human papillomavirus (HPV) test is another test that may be used for cervical cancer screening. The HPV test looks for the virus that can cause cell changes in the cervix. The cells collected during the Pap test can be tested for HPV.  The HPV test can be used to screen women 30 years of age and older. Getting tested for HPV can extend the interval between normal Pap tests from three to five years.  An HPV test also should be used to screen women of any age who have unclear Pap test results.  After 39 years of age, women should have HPV testing as often as Pap tests.  Colorectal Cancer  This type of cancer can be detected and often prevented.  Routine colorectal cancer screening usually begins at 39 years of  age and continues through 39 years of age.  Your health care provider may recommend screening at an earlier age if you have risk factors for colon cancer.  Your health care provider may also recommend using home test kits to check for hidden blood in the stool.  A small camera at the end of a tube can be used to examine your colon directly (sigmoidoscopy or colonoscopy). This is done to check for the earliest forms of colorectal cancer.  Routine screening usually begins at age 50.  Direct examination of the colon should be repeated every 5-10 years through 39 years of age. However, you may need to be screened more often if early forms of precancerous polyps or small growths are found. Skin Cancer  Check your skin from head to toe regularly.  Tell your health care provider about any new moles or changes in   moles, especially if there is a change in a mole's shape or color.  Also tell your health care provider if you have a mole that is larger than the size of a pencil eraser.  Always use sunscreen. Apply sunscreen liberally and repeatedly throughout the day.  Protect yourself by wearing long sleeves, pants, a wide-brimmed hat, and sunglasses whenever you are outside. HEART DISEASE, DIABETES, AND HIGH BLOOD PRESSURE   Have your blood pressure checked at least every 1-2 years. High blood pressure causes heart disease and increases the risk of stroke.  If you are between 75 years and 42 years old, ask your health care provider if you should take aspirin to prevent strokes.  Have regular diabetes screenings. This involves taking a blood sample to check your fasting blood sugar level.  If you are at a normal weight and have a low risk for diabetes, have this test once every three years after 39 years of age.  If you are overweight and have a high risk for diabetes, consider being tested at a younger age or more often. PREVENTING INFECTION  Hepatitis B  If you have a higher risk for  hepatitis B, you should be screened for this virus. You are considered at high risk for hepatitis B if:  You were born in a country where hepatitis B is common. Ask your health care provider which countries are considered high risk.  Your parents were born in a high-risk country, and you have not been immunized against hepatitis B (hepatitis B vaccine).  You have HIV or AIDS.  You use needles to inject street drugs.  You live with someone who has hepatitis B.  You have had sex with someone who has hepatitis B.  You get hemodialysis treatment.  You take certain medicines for conditions, including cancer, organ transplantation, and autoimmune conditions. Hepatitis C  Blood testing is recommended for:  Everyone born from [birth year] through 1965.  Anyone with known risk factors for hepatitis C. Sexually transmitted infections (STIs)  You should be screened for sexually transmitted infections (STIs) including gonorrhea and chlamydia if:  You are sexually active and are younger than 39 years of age.  You are older than 39 years of age and your health care provider tells you that you are at risk for this type of infection.  Your sexual activity has changed since you were last screened and you are at an increased risk for chlamydia or gonorrhea. Ask your health care provider if you are at risk.  If you do not have HIV, but are at risk, it may be recommended that you take a prescription medicine daily to prevent HIV infection. This is called pre-exposure prophylaxis (PrEP). You are considered at risk if:  You are sexually active and do not regularly use condoms or know the HIV status of your partner(s).  You take drugs by injection.  You are sexually active with a partner who has HIV. Talk with your health care provider about whether you are at high risk of being infected with HIV. If you choose to begin PrEP, you should first be tested for HIV. You should then be tested every 3 months for  as long as you are taking PrEP.  PREGNANCY   If you are premenopausal and you may become pregnant, ask your health care provider about preconception counseling.  If you may become pregnant, take 400 to 800 micrograms (mcg) of folic acid every day.  If you want to prevent pregnancy, talk to your  health care provider about birth control (contraception). OSTEOPOROSIS AND MENOPAUSE   Osteoporosis is a disease in which the bones lose minerals and strength with aging. This can result in serious bone fractures. Your risk for osteoporosis can be identified using a bone density scan.  If you are 77 years of age or older, or if you are at risk for osteoporosis and fractures, ask your health care provider if you should be screened.  Ask your health care provider whether you should take a calcium or vitamin D supplement to lower your risk for osteoporosis.  Menopause may have certain physical symptoms and risks.  Hormone replacement therapy may reduce some of these symptoms and risks. Talk to your health care provider about whether hormone replacement therapy is right for you.  HOME CARE INSTRUCTIONS   Schedule regular health, dental, and eye exams.  Stay current with your immunizations.   Do not use any tobacco products including cigarettes, chewing tobacco, or electronic cigarettes.  If you are pregnant, do not drink alcohol.  If you are breastfeeding, limit how much and how often you drink alcohol.  Limit alcohol intake to no more than 1 drink per day for nonpregnant women. One drink equals 12 ounces of beer, 5 ounces of wine, or 1 ounces of hard liquor.  Do not use street drugs.  Do not share needles.  Ask your health care provider for help if you need support or information about quitting drugs.  Tell your health care provider if you often feel depressed.  Tell your health care provider if you have ever been abused or do not feel safe at home. Document Released: 02/16/2011  Document Revised: 12/18/2013 Document Reviewed: 07/05/2013 [facility] Patient Information 2015 [place], [place]. This information is not intended to replace advice given to you by your health care provider. Make sure you discuss any questions you have with your health care provider. Fat and Cholesterol Control Diet Your diet has an affect on your fat and cholesterol levels in your blood and organs. Too much fat and cholesterol in your blood can affect your:  Heart.  Blood vessels (arteries, veins).  Gallbladder.  Liver.  Pancreas. CONTROL FAT AND CHOLESTEROL WITH DIET Certain foods raise cholesterol and others lower it. It is important to replace bad fats with other types of fat.  Do not eat:  Fatty meats, such as hot dogs and salami.  Stick margarine and some tub margarines that have "partially hydrogenated oils" in them.  Baked goods, such as cookies and crackers that have "partially hydrogenated oils" in them.  Saturated tropical oils, such as coconut and palm oil. Eat the following foods:  Round or loin cuts of red meat.  Chicken (without skin).  Fish.  Veal.  [place] breast.  Shellfish.  Fruit, such as apples.  Vegetables, such as broccoli, potatoes, and carrots.  Beans, peas, and lentils (legumes).  Grains, such as barley, rice, couscous, and bulgar wheat.  Pasta (without cream sauces). Look for foods that are nonfat, low in fat, and low in cholesterol.  FIND FOODS THAT ARE LOWER IN FAT AND CHOLESTEROL  Find foods with soluble fiber and plant sterols (phytosterol). You should eat 2 grams a day of these foods. These foods include:  Fruits.  Vegetables.  Whole grains.  Dried beans and peas.  Nuts and seeds.  Read package labels. Look for low-saturated fats, trans fat free, low-fat foods.  Choose cheese that have only 2 to 3 grams of saturated fat per ounce.  Use  heart-healthy tub margarine that is free of trans fat or partially hydrogenated  oil.  Avoid buying baked goods that have partially hydrogenated oils in them. Instead, buy baked goods made with whole grains (whole-wheat or whole oat flour). Avoid baked goods labeled with "flour" or "enriched flour."  Buy non-creamy canned soups with reduced salt and no added fats. PREPARING YOUR FOOD  Broil, bake, steam, or roast foods. Do not fry food.  Use non-stick cooking sprays.  Use lemon or herbs to flavor food instead of using butter or stick margarine.  Use nonfat yogurt, salsa, or low-fat dressings for salads. LOW-SATURATED FAT / LOW-FAT FOOD SUBSTITUTES  Meats / Saturated Fat (g)  Avoid: Steak, marbled (3 oz/85 g) / 11 g.  Choose: Steak, lean (3 oz/85 g) / 4 g.  Avoid: Hamburger (3 oz/85 g) / 7 g.  Choose: Hamburger, lean (3 oz/85 g) / 5 g.  Avoid: Ham (3 oz/85 g) / 6 g.  Choose: Ham, lean cut (3 oz/85 g) / 2.4 g.  Avoid: Chicken, with skin, dark meat (3 oz/85 g) / 4 g.  Choose: Chicken, skin removed, dark meat (3 oz/85 g) / 2 g.  Avoid: Chicken, with skin, light meat (3 oz/85 g) / 2.5 g.  Choose: Chicken, skin removed, light meat (3 oz/85 g) / 1 g. Dairy / Saturated Fat (g)  Avoid: Whole milk (1 cup) / 5 g.  Choose: Low-fat milk, 2% (1 cup) / 3 g.  Choose: Low-fat milk, 1% (1 cup) / 1.5 g.  Choose: Skim milk (1 cup) / 0.3 g.  Avoid: Hard cheese (1 oz/28 g) / 6 g.  Choose: Skim milk cheese (1 oz/28 g) / 2 to 3 g.  Avoid: Cottage cheese, 4% fat (1 cup) / 6.5 g.  Choose: Low-fat cottage cheese, 1% fat (1 cup) / 1.5 g.  Avoid: Ice cream (1 cup) / 9 g.  Choose: Sherbet (1 cup) / 2.5 g.  Choose: Nonfat frozen yogurt (1 cup) / 0.3 g.  Choose: Frozen fruit bar / trace.  Avoid: Whipped cream (1 tbs) / 3.5 g.  Choose: Nondairy whipped topping (1 tbs) / 1 g. Condiments / Saturated Fat (g)  Avoid: Mayonnaise (1 tbs) / 2 g.  Choose: Low-fat mayonnaise (1 tbs) / 1 g.  Avoid: Butter (1 tbs) / 7 g.  Choose: Extra light margarine (1 tbs) / 1  g.  Avoid: Coconut oil (1 tbs) / 11.8 g.  Choose: Olive oil (1 tbs) / 1.8 g.  Choose: Corn oil (1 tbs) / 1.7 g.  Choose: Safflower oil (1 tbs) / 1.2 g.  Choose: Sunflower oil (1 tbs) / 1.4 g.  Choose: Soybean oil (1 tbs) / 2.4 g .  Choose: Canola oil (1 tbs) / 1 g. Document Released: 02/02/2012 Document Revised: 04/05/2013 Document Reviewed: 11/02/2013 [facility] Patient Information 2015 [place], [place]. This information is not intended to replace advice given to you by your health care provider. Make sure you discuss any questions you have with your health care provider.
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
## 2015-03-23 LAB — URINALYSIS W MICROSCOPIC + REFLEX CULTURE
BILIRUBIN URINE: NEGATIVE
Bacteria, UA: NONE SEEN [HPF]
CASTS: NONE SEEN [LPF]
Crystals: NONE SEEN [HPF]
Glucose, UA: NEGATIVE
Hgb urine dipstick: NEGATIVE
Ketones, ur: NEGATIVE
LEUKOCYTES UA: NEGATIVE
Nitrite: NEGATIVE
PROTEIN: NEGATIVE
SPECIFIC GRAVITY, URINE: 1.027 (ref 1.001–1.035)
Squamous Epithelial / LPF: NONE SEEN [HPF] (ref <=5)
Yeast: NONE SEEN [HPF]
pH: 6 (ref 5.0–8.0)

## 2015-03-23 LAB — URINE CULTURE
Colony Count: NO GROWTH
Organism ID, Bacteria: NO GROWTH

## 2015-03-25 ENCOUNTER — Other Ambulatory Visit: Payer: Self-pay | Admitting: Gynecology

## 2015-03-25 DIAGNOSIS — E781 Pure hyperglyceridemia: Secondary | ICD-10-CM

## 2015-03-25 DIAGNOSIS — R7309 Other abnormal glucose: Secondary | ICD-10-CM

## 2015-08-08 ENCOUNTER — Other Ambulatory Visit: Payer: BC Managed Care – PPO

## 2015-08-08 DIAGNOSIS — R7309 Other abnormal glucose: Secondary | ICD-10-CM

## 2015-08-08 DIAGNOSIS — E781 Pure hyperglyceridemia: Secondary | ICD-10-CM

## 2015-08-08 LAB — LIPID PANEL
CHOL/HDL RATIO: 2.8 ratio (ref <=5.0)
CHOLESTEROL: 173 mg/dL (ref 125–200)
HDL: 62 mg/dL (ref >=46)
LDL CALC: 98 mg/dL (ref <130)
Triglycerides: 63 mg/dL (ref <150)
VLDL: 13 mg/dL (ref <30)

## 2015-08-08 LAB — HEMOGLOBIN A1C
Hgb A1c MFr Bld: 6 % — ABNORMAL HIGH (ref <5.7)
MEAN PLASMA GLUCOSE: 126 mg/dL — AB (ref <117)

## 2015-08-09 ENCOUNTER — Other Ambulatory Visit: Payer: Self-pay | Admitting: *Deleted

## 2015-08-09 DIAGNOSIS — R7309 Other abnormal glucose: Secondary | ICD-10-CM

## 2015-11-27 ENCOUNTER — Other Ambulatory Visit: Payer: BC Managed Care – PPO

## 2015-11-27 DIAGNOSIS — R7309 Other abnormal glucose: Secondary | ICD-10-CM

## 2015-11-27 LAB — HEMOGLOBIN A1C
Hgb A1c MFr Bld: 6 % — ABNORMAL HIGH (ref <5.7)
Mean Plasma Glucose: 126 mg/dL

## 2016-03-31 ENCOUNTER — Encounter: Payer: BC Managed Care – PPO | Admitting: Women's Health

## 2016-04-09 ENCOUNTER — Encounter: Payer: BC Managed Care – PPO | Admitting: Women's Health

## 2016-05-05 ENCOUNTER — Ambulatory Visit (INDEPENDENT_AMBULATORY_CARE_PROVIDER_SITE_OTHER): Payer: BC Managed Care – PPO | Admitting: Women's Health

## 2016-05-05 ENCOUNTER — Encounter: Payer: Self-pay | Admitting: Women's Health

## 2016-05-05 VITALS — BP 129/86 | Ht 65.0 in | Wt 248.0 lb

## 2016-05-05 DIAGNOSIS — Z8489 Family history of other specified conditions: Secondary | ICD-10-CM

## 2016-05-05 DIAGNOSIS — Z833 Family history of diabetes mellitus: Secondary | ICD-10-CM

## 2016-05-05 DIAGNOSIS — Z01419 Encounter for gynecological examination (general) (routine) without abnormal findings: Secondary | ICD-10-CM | POA: Diagnosis not present

## 2016-05-05 DIAGNOSIS — Z13220 Encounter for screening for lipoid disorders: Secondary | ICD-10-CM

## 2016-05-05 LAB — CBC WITH DIFFERENTIAL/PLATELET
Basophils Absolute: 0 cells/uL (ref 0–200)
Basophils Relative: 0 %
EOS ABS: 70 {cells}/uL (ref 15–500)
Eosinophils Relative: 1 %
HEMATOCRIT: 41.6 % (ref 35.0–45.0)
Hemoglobin: 13.2 g/dL (ref 11.7–15.5)
Lymphocytes Relative: 33 %
Lymphs Abs: 2310 cells/uL (ref 850–3900)
MCH: 26.5 pg — ABNORMAL LOW (ref 27.0–33.0)
MCHC: 31.7 g/dL — ABNORMAL LOW (ref 32.0–36.0)
MCV: 83.5 fL (ref 80.0–100.0)
MONO ABS: 630 {cells}/uL (ref 200–950)
MPV: 9.2 fL (ref 7.5–12.5)
Monocytes Relative: 9 %
NEUTROS ABS: 3990 {cells}/uL (ref 1500–7800)
NEUTROS PCT: 57 %
Platelets: 304 10*3/uL (ref 140–400)
RBC: 4.98 MIL/uL (ref 3.80–5.10)
RDW: 13.2 % (ref 11.0–15.0)
WBC: 7 10*3/uL (ref 3.8–10.8)

## 2016-05-05 LAB — COMPREHENSIVE METABOLIC PANEL
ALT: 14 U/L (ref 6–29)
AST: 12 U/L (ref 10–30)
Albumin: 4.2 g/dL (ref 3.6–5.1)
Alkaline Phosphatase: 80 U/L (ref 33–115)
BUN: 11 mg/dL (ref 7–25)
CHLORIDE: 103 mmol/L (ref 98–110)
CO2: 28 mmol/L (ref 20–31)
CREATININE: 0.77 mg/dL (ref 0.50–1.10)
Calcium: 9.2 mg/dL (ref 8.6–10.2)
Glucose, Bld: 85 mg/dL (ref 65–99)
Potassium: 4.1 mmol/L (ref 3.5–5.3)
SODIUM: 139 mmol/L (ref 135–146)
TOTAL PROTEIN: 7.2 g/dL (ref 6.1–8.1)
Total Bilirubin: 0.8 mg/dL (ref 0.2–1.2)

## 2016-05-05 LAB — LIPID PANEL
Cholesterol: 179 mg/dL (ref 125–200)
HDL: 70 mg/dL (ref >=46)
LDL CALC: 91 mg/dL (ref <130)
Total CHOL/HDL Ratio: 2.6 Ratio (ref <=5.0)
Triglycerides: 91 mg/dL (ref <150)
VLDL: 18 mg/dL (ref <30)

## 2016-05-05 NOTE — Patient Instructions (Signed)
Basic Carbohydrate Counting for Diabetes Mellitus Carbohydrate counting is a method for keeping track of the amount of carbohydrates you eat. Eating carbohydrates naturally increases the level of sugar (glucose) in your blood, so it is important for you to know the amount that is okay for you to have in every meal. Carbohydrate counting helps keep the level of glucose in your blood within normal limits. The amount of carbohydrates allowed is different for every person. A dietitian can help you calculate the amount that is right for you. Once you know the amount of carbohydrates you can have, you can count the carbohydrates in the foods you want to eat. Carbohydrates are found in the following foods:  Grains, such as breads and cereals.  Dried beans and soy products.  Starchy vegetables, such as potatoes, peas, and corn.  Fruit and fruit juices.  Milk and yogurt.  Sweets and snack foods, such as cake, cookies, candy, chips, soft drinks, and fruit drinks. CARBOHYDRATE COUNTING There are two ways to count the carbohydrates in your food. You can use either of the methods or a combination of both. Reading the "Nutrition Facts" on [place] The "Nutrition Facts" is an area that is included on the labels of almost all packaged food and beverages in the [place]. It includes the serving size of that food or beverage and information about the nutrients in each serving of the food, including the grams (g) of carbohydrate per serving.  Decide the number of servings of this food or beverage that you will be able to eat or drink. Multiply that number of servings by the number of grams of carbohydrate that is listed on the label for that serving. The total will be the amount of carbohydrates you will be having when you eat or drink this food or beverage. Learning Standard Serving Sizes of Food When you eat food that is not packaged or does not include "Nutrition Facts" on the label, you need to  measure the servings in order to count the amount of carbohydrates.A serving of most carbohydrate-rich foods contains about 15 g of carbohydrates. The following list includes serving sizes of carbohydrate-rich foods that provide 15 g ofcarbohydrate per serving:   1 slice of bread (1 oz) or 1 six-inch tortilla.    of a hamburger bun or English muffin.  4-6 crackers.   cup unsweetened dry cereal.    cup hot cereal.   cup rice or pasta.    cup mashed potatoes or  of a large baked potato.  1 cup fresh fruit or one small piece of fruit.    cup canned or frozen fruit or fruit juice.  1 cup milk.   cup plain fat-free yogurt or yogurt sweetened with artificial sweeteners.   cup cooked dried beans or starchy vegetable, such as peas, corn, or potatoes.  Decide the number of standard-size servings that you will eat. Multiply that number of servings by 15 (the grams of carbohydrates in that serving). For example, if you eat 2 cups of strawberries, you will have eaten 2 servings and 30 g of carbohydrates (2 servings x 15 g = 30 g). For foods such as soups and casseroles, in which more than one food is mixed in, you will need to count the carbohydrates in each food that is included. EXAMPLE OF CARBOHYDRATE COUNTING Sample Dinner  3 oz chicken breast.   cup of brown rice.   cup of corn.  1 cup milk.   1 cup strawberries with  sugar-free whipped topping.  Carbohydrate Calculation Step 1: Identify the foods that contain carbohydrates:   Rice.   Corn.   Milk.   Strawberries. Step 2:Calculate the number of servings eaten of each:   2 servings of rice.   1 serving of corn.   1 serving of milk.   1 serving of strawberries. Step 3: Multiply each of those number of servings by 15 g:   2 servings of rice x 15 g = 30 g.   1 serving of corn x 15 g = 15 g.   1 serving of milk x 15 g = 15 g.   1 serving of strawberries x 15 g = 15 g. Step 4: Add  together all of the amounts to find the total grams of carbohydrates eaten: 30 g + 15 g + 15 g + 15 g = 75 g.   This information is not intended to replace advice given to you by your health care provider. Make sure you discuss any questions you have with your health care provider.   Document Released: 08/03/2005 Document Revised: 08/24/2014 Document Reviewed: 06/30/2013 Elsevier Interactive Patient Education 2016 Heath Maintenance, Female Adopting a healthy lifestyle and getting preventive care can go a long way to promote health and wellness. Talk with your health care provider about what schedule of regular examinations is right for you. This is a good chance for you to check in with your provider about disease prevention and staying healthy. In between checkups, there are plenty of things you can do on your own. Experts have done a lot of research about which lifestyle changes and preventive measures are most likely to keep you healthy. Ask your health care provider for more information. WEIGHT AND DIET  Eat a healthy diet  Be sure to include plenty of vegetables, fruits, low-fat dairy products, and lean protein.  Do not eat a lot of foods high in solid fats, added sugars, or salt.  Get regular exercise. This is one of the most important things you can do for your health.  Most adults should exercise for at least 150 minutes each week. The exercise should increase your heart rate and make you sweat (moderate-intensity exercise).  Most adults should also do strengthening exercises at least twice a week. This is in addition to the moderate-intensity exercise.  Maintain a healthy weight  Body mass index (BMI) is a measurement that can be used to identify possible weight problems. It estimates body fat based on height and weight. Your health care provider can help determine your BMI and help you achieve or maintain a healthy weight.  For females 87 years of age and older:    A BMI below 18.5 is considered underweight.  A BMI of 18.5 to 24.9 is normal.  A BMI of 25 to 29.9 is considered overweight.  A BMI of 30 and above is considered obese.  Watch levels of cholesterol and blood lipids  You should start having your blood tested for lipids and cholesterol at 40 years of age, then have this test every 5 years.  You may need to have your cholesterol levels checked more often if:  Your lipid or cholesterol levels are high.  You are older than 40 years of age.  You are at high risk for heart disease.  CANCER SCREENING   Lung Cancer  Lung cancer screening is recommended for adults 12-76 years old who are at high risk for lung cancer because of a history of  smoking.  A yearly low-dose CT scan of the lungs is recommended for people who:  Currently smoke.  Have quit within the past 15 years.  Have at least a 30-pack-year history of smoking. A pack year is smoking an average of one pack of cigarettes a day for 1 year.  Yearly screening should continue until it has been 15 years since you quit.  Yearly screening should stop if you develop a health problem that would prevent you from having lung cancer treatment.  Breast Cancer  Practice breast self-awareness. This means understanding how your breasts normally appear and feel.  It also means doing regular breast self-exams. Let your health care provider know about any changes, no matter how small.  If you are in your 20s or 30s, you should have a clinical breast exam (CBE) by a health care provider every 1-3 years as part of a regular health exam.  If you are 66 or older, have a CBE every year. Also consider having a breast X-ray (mammogram) every year.  If you have a family history of breast cancer, talk to your health care provider about genetic screening.  If you are at high risk for breast cancer, talk to your health care provider about having an MRI and a mammogram every year.  Breast  cancer gene (BRCA) assessment is recommended for women who have family members with BRCA-related cancers. BRCA-related cancers include:  Breast.  Ovarian.  Tubal.  Peritoneal cancers.  Results of the assessment will determine the need for genetic counseling and BRCA1 and BRCA2 testing. Cervical Cancer Your health care provider may recommend that you be screened regularly for cancer of the pelvic organs (ovaries, uterus, and vagina). This screening involves a pelvic examination, including checking for microscopic changes to the surface of your cervix (Pap test). You may be encouraged to have this screening done every 3 years, beginning at age 24.  For women ages 17-65, health care providers may recommend pelvic exams and Pap testing every 3 years, or they may recommend the Pap and pelvic exam, combined with testing for human papilloma virus (HPV), every 5 years. Some types of HPV increase your risk of cervical cancer. Testing for HPV may also be done on women of any age with unclear Pap test results.  Other health care providers may not recommend any screening for nonpregnant women who are considered low risk for pelvic cancer and who do not have symptoms. Ask your health care provider if a screening pelvic exam is right for you.  If you have had past treatment for cervical cancer or a condition that could lead to cancer, you need Pap tests and screening for cancer for at least 20 years after your treatment. If Pap tests have been discontinued, your risk factors (such as having a new sexual partner) need to be reassessed to determine if screening should resume. Some women have medical problems that increase the chance of getting cervical cancer. In these cases, your health care provider may recommend more frequent screening and Pap tests. Colorectal Cancer  This type of cancer can be detected and often prevented.  Routine colorectal cancer screening usually begins at 40 years of age and  continues through 40 years of age.  Your health care provider may recommend screening at an earlier age if you have risk factors for colon cancer.  Your health care provider may also recommend using home test kits to check for hidden blood in the stool.  A small camera at the end  of a tube can be used to examine your colon directly (sigmoidoscopy or colonoscopy). This is done to check for the earliest forms of colorectal cancer.  Routine screening usually begins at age 50.  Direct examination of the colon should be repeated every 5-10 years through 40 years of age. However, you may need to be screened more often if early forms of precancerous polyps or small growths are found. Skin Cancer  Check your skin from head to toe regularly.  Tell your health care provider about any new moles or changes in moles, especially if there is a change in a mole's shape or color.  Also tell your health care provider if you have a mole that is larger than the size of a pencil eraser.  Always use sunscreen. Apply sunscreen liberally and repeatedly throughout the day.  Protect yourself by wearing long sleeves, pants, a wide-brimmed hat, and sunglasses whenever you are outside. HEART DISEASE, DIABETES, AND HIGH BLOOD PRESSURE   High blood pressure causes heart disease and increases the risk of stroke. High blood pressure is more likely to develop in:  People who have blood pressure in the high end of the normal range (130-139/85-89 mm Hg).  People who are overweight or obese.  People who are African American.  If you are 18-39 years of age, have your blood pressure checked every 3-5 years. If you are 40 years of age or older, have your blood pressure checked every year. You should have your blood pressure measured twice--once when you are at a hospital or clinic, and once when you are not at a hospital or clinic. Record the average of the two measurements. To check your blood pressure when you are not at  a hospital or clinic, you can use:  An automated blood pressure machine at a pharmacy.  A home blood pressure monitor.  If you are between 55 years and 79 years old, ask your health care provider if you should take aspirin to prevent strokes.  Have regular diabetes screenings. This involves taking a blood sample to check your fasting blood sugar level.  If you are at a normal weight and have a low risk for diabetes, have this test once every three years after 40 years of age.  If you are overweight and have a high risk for diabetes, consider being tested at a younger age or more often. PREVENTING INFECTION  Hepatitis B  If you have a higher risk for hepatitis B, you should be screened for this virus. You are considered at high risk for hepatitis B if:  You were born in a country where hepatitis B is common. Ask your health care provider which countries are considered high risk.  Your parents were born in a high-risk country, and you have not been immunized against hepatitis B (hepatitis B vaccine).  You have HIV or AIDS.  You use needles to inject street drugs.  You live with someone who has hepatitis B.  You have had sex with someone who has hepatitis B.  You get hemodialysis treatment.  You take certain medicines for conditions, including cancer, organ transplantation, and autoimmune conditions. Hepatitis C  Blood testing is recommended for:  Everyone born from 1945 through 1965.  Anyone with known risk factors for hepatitis C. Sexually transmitted infections (STIs)  You should be screened for sexually transmitted infections (STIs) including gonorrhea and chlamydia if:  You are sexually active and are younger than 40 years of age.  You are older than 40   years of age and your health care provider tells you that you are at risk for this type of infection.  Your sexual activity has changed since you were last screened and you are at an increased risk for chlamydia or  gonorrhea. Ask your health care provider if you are at risk.  If you do not have HIV, but are at risk, it may be recommended that you take a prescription medicine daily to prevent HIV infection. This is called pre-exposure prophylaxis (PrEP). You are considered at risk if:  You are sexually active and do not regularly use condoms or know the HIV status of your partner(s).  You take drugs by injection.  You are sexually active with a partner who has HIV. Talk with your health care provider about whether you are at high risk of being infected with HIV. If you choose to begin PrEP, you should first be tested for HIV. You should then be tested every 3 months for as long as you are taking PrEP.  PREGNANCY   If you are premenopausal and you may become pregnant, ask your health care provider about preconception counseling.  If you may become pregnant, take 400 to 800 micrograms (mcg) of folic acid every day.  If you want to prevent pregnancy, talk to your health care provider about birth control (contraception). OSTEOPOROSIS AND MENOPAUSE   Osteoporosis is a disease in which the bones lose minerals and strength with aging. This can result in serious bone fractures. Your risk for osteoporosis can be identified using a bone density scan.  If you are 65 years of age or older, or if you are at risk for osteoporosis and fractures, ask your health care provider if you should be screened.  Ask your health care provider whether you should take a calcium or vitamin D supplement to lower your risk for osteoporosis.  Menopause may have certain physical symptoms and risks.  Hormone replacement therapy may reduce some of these symptoms and risks. Talk to your health care provider about whether hormone replacement therapy is right for you.  HOME CARE INSTRUCTIONS   Schedule regular health, dental, and eye exams.  Stay current with your immunizations.   Do not use any tobacco products including  cigarettes, chewing tobacco, or electronic cigarettes.  If you are pregnant, do not drink alcohol.  If you are breastfeeding, limit how much and how often you drink alcohol.  Limit alcohol intake to no more than 1 drink per day for nonpregnant women. One drink equals 12 ounces of beer, 5 ounces of wine, or 1 ounces of hard liquor.  Do not use street drugs.  Do not share needles.  Ask your health care provider for help if you need support or information about quitting drugs.  Tell your health care provider if you often feel depressed.  Tell your health care provider if you have ever been abused or do not feel safe at home.   This information is not intended to replace advice given to you by your health care provider. Make sure you discuss any questions you have with your health care provider.   Document Released: 02/16/2011 Document Revised: 08/24/2014 Document Reviewed: 07/05/2013 Elsevier Interactive Patient Education 2016 Elsevier Inc.  

## 2016-05-05 NOTE — Progress Notes (Signed)
[name] May 22, 1976 [id number]  History:    Presents for annual exam. States no cycle in August but LMP Sept. 1 and normal, BTL. History of occasional missed cycles in the past. 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 History of GDM with Insulin. 11/2015 HgbA1c 6.0. Has not had screening mammogram yet.   Past medical history, past surgical history, family history and social history were all reviewed and documented in the EPIC chart. 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 Husband and sons, ages 34 and 71 doing well. Mother with history of HTN and died from "natural causes" at 53, not in contact with father.  ROS:  A ROS was performed and pertinent positives and negatives are included.  Exam:  Vitals:   05/05/16 1054  BP: 129/86  Weight: 248 lb (112.5 kg)  Height: 5\' 5"  (1.651 m)   Body mass index is 41.27 kg/m.   General appearance:  Normal Thyroid:  Symmetrical, normal in size, without palpable masses or nodularity. Respiratory  Auscultation:  Clear without wheezing or rhonchi Cardiovascular  Auscultation:  Regular rate, without rubs, murmurs or gallops  Edema/varicosities:  Not grossly evident Abdominal  Soft,nontender, without masses, guarding or rebound.  Liver/spleen:  No organomegaly noted  Hernia:  None appreciated  Skin  Inspection:  Grossly normal   Breasts: Examined lying and sitting.     Right: Fibrous but without masses, retractions,discharge or axillary adenopathy.     Left: Fibrous but without masses, retractions, discharge or axillary adenopathy. Gentitourinary   Inguinal/mons:  Normal without inguinal adenopathy  External genitalia:  Normal  BUS/Urethra/Skene's glands:  Normal  Vagina:  Normal  Cervix:  Normal  Uterus:  Normal in size, shape and contour.  Midline and mobile  Adnexa/parametria:     Rt: Without masses or tenderness.   Lt: Without masses or tenderness.  Anus and perineum: Normal  Digital  rectal exam: Normal sphincter tone without palpated masses or tenderness  Assessment/Plan:  40 y.o.  MBF G2P2 for annual exam without complaints.  Monthly cycle/BTL, Aug. 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 Discussed increased exercise and decreased calories for weight loss. Vit D 1000 daily encouraged. Advised to recheck blood pressure outside of office and follow-up with PCP if multiple measurements over 130/80. 2015 Pap normal and negative HR HPV, reviewed new screening guidelines. UA, lipid panel, HgbA1c, CMP, CBC.    [name], [name] WHNP, 11:38 AM 05/05/2016

## 2016-05-06 LAB — URINALYSIS W MICROSCOPIC + REFLEX CULTURE
BACTERIA UA: NONE SEEN [HPF]
BILIRUBIN URINE: NEGATIVE
Casts: NONE SEEN [LPF]
Crystals: NONE SEEN [HPF]
GLUCOSE, UA: NEGATIVE
HGB URINE DIPSTICK: NEGATIVE
KETONES UR: NEGATIVE
LEUKOCYTES UA: NEGATIVE
Nitrite: NEGATIVE
PROTEIN: NEGATIVE
RBC / HPF: NONE SEEN RBC/HPF (ref <=2)
Specific Gravity, Urine: 1.022 (ref 1.001–1.035)
WBC UA: NONE SEEN WBC/HPF (ref <=5)
Yeast: NONE SEEN [HPF]
pH: 7 (ref 5.0–8.0)

## 2016-05-06 LAB — HEMOGLOBIN A1C
Hgb A1c MFr Bld: 5.7 % — ABNORMAL HIGH (ref <5.7)
Mean Plasma Glucose: 117 mg/dL

## 2016-12-30 ENCOUNTER — Encounter: Payer: Self-pay | Admitting: Gynecology

## 2017-04-05 ENCOUNTER — Encounter: Payer: Self-pay | Admitting: Women's Health

## 2017-06-28 ENCOUNTER — Encounter: Payer: Self-pay | Admitting: Women's Health

## 2017-06-28 ENCOUNTER — Ambulatory Visit (INDEPENDENT_AMBULATORY_CARE_PROVIDER_SITE_OTHER): Payer: BC Managed Care – PPO | Admitting: Women's Health

## 2017-06-28 VITALS — BP 126/80 | Ht 66.0 in | Wt 254.0 lb

## 2017-06-28 DIAGNOSIS — Z01419 Encounter for gynecological examination (general) (routine) without abnormal findings: Secondary | ICD-10-CM | POA: Diagnosis not present

## 2017-06-28 DIAGNOSIS — Z8632 Personal history of gestational diabetes: Secondary | ICD-10-CM | POA: Diagnosis not present

## 2017-06-28 DIAGNOSIS — Z23 Encounter for immunization: Secondary | ICD-10-CM

## 2017-06-28 DIAGNOSIS — Z13220 Encounter for screening for lipoid disorders: Secondary | ICD-10-CM

## 2017-06-28 NOTE — Progress Notes (Signed)
LACHANDA BUCZEK 04-Oct-1975 [id number]    History:    Presents for annual exam.  Regular monthly cycle/BTL. 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 Gestational diabetes on insulin first pregnancy, GDM second pregnancy diet. Normal mammogram history.  Past medical history, past surgical history, family history and social history were all reviewed and documented in the EPIC chart. 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 Mother hypertension died at age 24.  ROS:  A ROS was performed and pertinent positives and negatives are included.  Exam:  Vitals:   06/28/17 0959  BP: 126/80  Weight: 254 lb (115.2 kg)  Height: 5\' 6"  (1.676 m)   Body mass index is 41 kg/m.   General appearance:  Normal Thyroid:  Symmetrical, normal in size, without palpable masses or nodularity. Respiratory  Auscultation:  Clear without wheezing or rhonchi Cardiovascular  Auscultation:  Regular rate, without rubs, murmurs or gallops  Edema/varicosities:  Not grossly evident Abdominal  Soft,nontender, without masses, guarding or rebound.  Liver/spleen:  No organomegaly noted  Hernia:  None appreciated  Skin  Inspection:  Grossly normal   Breasts: Examined lying and sitting.     Right: Without masses, retractions, discharge or axillary adenopathy.     Left: Without masses, retractions, discharge or axillary adenopathy. Gentitourinary   Inguinal/mons:  Normal without inguinal adenopathy  External genitalia:  Normal  BUS/Urethra/Skene's glands:  Normal  Vagina:  Normal  Cervix:  Normal  Uterus:   normal in size, shape and contour.  Midline and mobile  Adnexa/parametria:     Rt: Without masses or tenderness.   Lt: Without masses or tenderness.  Anus and perineum: Normal  Digital rectal exam: Normal sphincter tone without palpated masses or tenderness  Assessment/Plan:  41 y.o. MBF G2 P2 for annual exam with no complaints.  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 Reviewed importance of increasing exercise and decreasing calories/carbs for weight loss. Briefly reviewed weight loss surgery, Weight Watchers. CBC, hemoglobin A1c, lipid panel, Pap with HR HPV typing, new screening guidelines reviewed.    [place], 10:49 AM 06/28/2017

## 2017-06-28 NOTE — Patient Instructions (Addendum)
Health Maintenance, Female Adopting a healthy lifestyle and getting preventive care can go a long way to promote health and wellness. Talk with your health care provider about what schedule of regular examinations is right for you. This is a good chance for you to check in with your provider about disease prevention and staying healthy. In between checkups, there are plenty of things you can do on your own. Experts have done a lot of research about which lifestyle changes and preventive measures are most likely to keep you healthy. Ask your health care provider for more information. Weight and diet Eat a healthy diet  Be sure to include plenty of vegetables, fruits, low-fat dairy products, and lean protein.  Do not eat a lot of foods high in solid fats, added sugars, or salt.  Get regular exercise. This is one of the most important things you can do for your health. ? Most adults should exercise for at least 150 minutes each week. The exercise should increase your heart rate and make you sweat (moderate-intensity exercise). ? Most adults should also do strengthening exercises at least twice a week. This is in addition to the moderate-intensity exercise.  Maintain a healthy weight  Body mass index (BMI) is a measurement that can be used to identify possible weight problems. It estimates body fat based on height and weight. Your health care provider can help determine your BMI and help you achieve or maintain a healthy weight.  For females 20 years of age and older: ? A BMI below 18.5 is considered underweight. ? A BMI of 18.5 to 24.9 is normal. ? A BMI of 25 to 29.9 is considered overweight. ? A BMI of 30 and above is considered obese.  Watch levels of cholesterol and blood lipids  You should start having your blood tested for lipids and cholesterol at 41 years of age, then have this test every 5 years.  You may need to have your cholesterol levels checked more often if: ? Your lipid or  cholesterol levels are high. ? You are older than 41 years of age. ? You are at high risk for heart disease.  Cancer screening Lung Cancer  Lung cancer screening is recommended for adults 55-80 years old who are at high risk for lung cancer because of a history of smoking.  A yearly low-dose CT scan of the lungs is recommended for people who: ? Currently smoke. ? Have quit within the past 15 years. ? Have at least a 30-pack-year history of smoking. A pack year is smoking an average of one pack of cigarettes a day for 1 year.  Yearly screening should continue until it has been 15 years since you quit.  Yearly screening should stop if you develop a health problem that would prevent you from having lung cancer treatment.  Breast Cancer  Practice breast self-awareness. This means understanding how your breasts normally appear and feel.  It also means doing regular breast self-exams. Let your health care provider know about any changes, no matter how small.  If you are in your 20s or 30s, you should have a clinical breast exam (CBE) by a health care provider every 1-3 years as part of a regular health exam.  If you are 40 or older, have a CBE every year. Also consider having a breast X-ray (mammogram) every year.  If you have a family history of breast cancer, talk to your health care provider about genetic screening.  If you are at high risk   for breast cancer, talk to your health care provider about having an MRI and a mammogram every year.  Breast cancer gene (BRCA) assessment is recommended for women who have family members with BRCA-related cancers. BRCA-related cancers include: ? Breast. ? Ovarian. ? Tubal. ? Peritoneal cancers.  Results of the assessment will determine the need for genetic counseling and BRCA1 and BRCA2 testing.  Cervical Cancer Your health care provider may recommend that you be screened regularly for cancer of the pelvic organs (ovaries, uterus, and  vagina). This screening involves a pelvic examination, including checking for microscopic changes to the surface of your cervix (Pap test). You may be encouraged to have this screening done every 3 years, beginning at age 22.  For women ages 56-65, health care providers may recommend pelvic exams and Pap testing every 3 years, or they may recommend the Pap and pelvic exam, combined with testing for human papilloma virus (HPV), every 5 years. Some types of HPV increase your risk of cervical cancer. Testing for HPV may also be done on women of any age with unclear Pap test results.  Other health care providers may not recommend any screening for nonpregnant women who are considered low risk for pelvic cancer and who do not have symptoms. Ask your health care provider if a screening pelvic exam is right for you.  If you have had past treatment for cervical cancer or a condition that could lead to cancer, you need Pap tests and screening for cancer for at least 20 years after your treatment. If Pap tests have been discontinued, your risk factors (such as having a new sexual partner) need to be reassessed to determine if screening should resume. Some women have medical problems that increase the chance of getting cervical cancer. In these cases, your health care provider may recommend more frequent screening and Pap tests.  Colorectal Cancer  This type of cancer can be detected and often prevented.  Routine colorectal cancer screening usually begins at 41 years of age and continues through 41 years of age.  Your health care provider may recommend screening at an earlier age if you have risk factors for colon cancer.  Your health care provider may also recommend using home test kits to check for hidden blood in the stool.  A small camera at the end of a tube can be used to examine your colon directly (sigmoidoscopy or colonoscopy). This is done to check for the earliest forms of colorectal  cancer.  Routine screening usually begins at age 33.  Direct examination of the colon should be repeated every 5-10 years through 41 years of age. However, you may need to be screened more often if early forms of precancerous polyps or small growths are found.  Skin Cancer  Check your skin from head to toe regularly.  Tell your health care provider about any new moles or changes in moles, especially if there is a change in a mole's shape or color.  Also tell your health care provider if you have a mole that is larger than the size of a pencil eraser.  Always use sunscreen. Apply sunscreen liberally and repeatedly throughout the day.  Protect yourself by wearing long sleeves, pants, a wide-brimmed hat, and sunglasses whenever you are outside.  Heart disease, diabetes, and high blood pressure  High blood pressure causes heart disease and increases the risk of stroke. High blood pressure is more likely to develop in: ? People who have blood pressure in the high end of  the normal range (130-139/85-89 mm Hg). ? People who are overweight or obese. ? People who are African American.  If you are 21-29 years of age, have your blood pressure checked every 3-5 years. If you are 3 years of age or older, have your blood pressure checked every year. You should have your blood pressure measured twice-once when you are at a hospital or clinic, and once when you are not at a hospital or clinic. Record the average of the two measurements. To check your blood pressure when you are not at a hospital or clinic, you can use: ? An automated blood pressure machine at a pharmacy. ? A home blood pressure monitor.  If you are between 17 years and 37 years old, ask your health care provider if you should take aspirin to prevent strokes.  Have regular diabetes screenings. This involves taking a blood sample to check your fasting blood sugar level. ? If you are at a normal weight and have a low risk for diabetes,  have this test once every three years after 41 years of age. ? If you are overweight and have a high risk for diabetes, consider being tested at a younger age or more often. Preventing infection Hepatitis B  If you have a higher risk for hepatitis B, you should be screened for this virus. You are considered at high risk for hepatitis B if: ? You were born in a country where hepatitis B is common. Ask your health care provider which countries are considered high risk. ? Your parents were born in a high-risk country, and you have not been immunized against hepatitis B (hepatitis B vaccine). ? You have HIV or AIDS. ? You use needles to inject street drugs. ? You live with someone who has hepatitis B. ? You have had sex with someone who has hepatitis B. ? You get hemodialysis treatment. ? You take certain medicines for conditions, including cancer, organ transplantation, and autoimmune conditions.  Hepatitis C  Blood testing is recommended for: ? Everyone born from [birth year] through 1965. ? Anyone with known risk factors for hepatitis C.  Sexually transmitted infections (STIs)  You should be screened for sexually transmitted infections (STIs) including gonorrhea and chlamydia if: ? You are sexually active and are younger than 41 years of age. ? You are older than 41 years of age and your health care provider tells you that you are at risk for this type of infection. ? Your sexual activity has changed since you were last screened and you are at an increased risk for chlamydia or gonorrhea. Ask your health care provider if you are at risk.  If you do not have HIV, but are at risk, it may be recommended that you take a prescription medicine daily to prevent HIV infection. This is called pre-exposure prophylaxis (PrEP). You are considered at risk if: ? You are sexually active and do not regularly use condoms or know the HIV status of your partner(s). ? You take drugs by injection. ? You are  sexually active with a partner who has HIV.  Talk with your health care provider about whether you are at high risk of being infected with HIV. If you choose to begin PrEP, you should first be tested for HIV. You should then be tested every 3 months for as long as you are taking PrEP. Pregnancy  If you are premenopausal and you may become pregnant, ask your health care provider about preconception counseling.  If you may become  pregnant, take 400 to 800 micrograms (mcg) of folic acid every day.  If you want to prevent pregnancy, talk to your health care provider about birth control (contraception). Osteoporosis and menopause  Osteoporosis is a disease in which the bones lose minerals and strength with aging. This can result in serious bone fractures. Your risk for osteoporosis can be identified using a bone density scan.  If you are 65 years of age or older, or if you are at risk for osteoporosis and fractures, ask your health care provider if you should be screened.  Ask your health care provider whether you should take a calcium or vitamin D supplement to lower your risk for osteoporosis.  Menopause may have certain physical symptoms and risks.  Hormone replacement therapy may reduce some of these symptoms and risks. Talk to your health care provider about whether hormone replacement therapy is right for you. Follow these instructions at home:  Schedule regular health, dental, and eye exams.  Stay current with your immunizations.  Do not use any tobacco products including cigarettes, chewing tobacco, or electronic cigarettes.  If you are pregnant, do not drink alcohol.  If you are breastfeeding, limit how much and how often you drink alcohol.  Limit alcohol intake to no more than 1 drink per day for nonpregnant women. One drink equals 12 ounces of beer, 5 ounces of wine, or 1 ounces of hard liquor.  Do not use street drugs.  Do not share needles.  Ask your health care  provider for help if you need support or information about quitting drugs.  Tell your health care provider if you often feel depressed.  Tell your health care provider if you have ever been abused or do not feel safe at home. This information is not intended to replace advice given to you by your health care provider. Make sure you discuss any questions you have with your health care provider. Document Released: 02/16/2011 Document Revised: 01/09/2016 Document Reviewed: 05/07/2015 Elsevier Interactive Patient Education  2018 [employer] Carbohydrate Counting for Diabetes Mellitus, Adult Carbohydrate counting is a method for keeping track of how many carbohydrates you eat. Eating carbohydrates naturally increases the amount of sugar (glucose) in the blood. Counting how many carbohydrates you eat helps keep your blood glucose within normal limits, which helps you manage your diabetes (diabetes mellitus). It is important to know how many carbohydrates you can safely have in each meal. This is different for every person. A diet and nutrition specialist (registered dietitian) can help you make a meal plan and calculate how many carbohydrates you should have at each meal and snack. Carbohydrates are found in the following foods:  Grains, such as breads and cereals.  Dried beans and soy products.  Starchy vegetables, such as potatoes, peas, and corn.  Fruit and fruit juices.  Milk and yogurt.  Sweets and snack foods, such as cake, cookies, candy, chips, and soft drinks.  How do I count carbohydrates? There are two ways to count carbohydrates in food. You can use either of the methods or a combination of both. Reading "Nutrition Facts" on packaged food The "Nutrition Facts" list is included on the labels of almost all packaged foods and beverages in the [place] It includes:  The serving size.  Information about nutrients in each serving, including the grams (g) of carbohydrate per  serving.  To use the "Nutrition Facts":  Decide how many servings you will have.  Multiply the number of servings by the number of carbohydrates   per serving.  The resulting number is the total amount of carbohydrates that you will be having.  Learning standard serving sizes of other foods When you eat foods containing carbohydrates that are not packaged or do not include "Nutrition Facts" on the label, you need to measure the servings in order to count the amount of carbohydrates:  Measure the foods that you will eat with a food scale or measuring cup, if needed.  Decide how many standard-size servings you will eat.  Multiply the number of servings by 15. Most carbohydrate-rich foods have about 15 g of carbohydrates per serving. ? For example, if you eat 8 oz (170 g) of strawberries, you will have eaten 2 servings and 30 g of carbohydrates (2 servings x 15 g = 30 g).  For foods that have more than one food mixed, such as soups and casseroles, you must count the carbohydrates in each food that is included.  The following list contains standard serving sizes of common carbohydrate-rich foods. Each of these servings has about 15 g of carbohydrates:   hamburger bun or  English muffin.   oz (15 mL) syrup.   oz (14 g) jelly.  1 slice of bread.  1 six-inch tortilla.  3 oz (85 g) cooked rice or pasta.  4 oz (113 g) cooked dried beans.  4 oz (113 g) starchy vegetable, such as peas, corn, or potatoes.  4 oz (113 g) hot cereal.  4 oz (113 g) mashed potatoes or  of a large baked potato.  4 oz (113 g) canned or frozen fruit.  4 oz (120 mL) fruit juice.  4-6 crackers.  6 chicken nuggets.  6 oz (170 g) unsweetened dry cereal.  6 oz (170 g) plain fat-free yogurt or yogurt sweetened with artificial sweeteners.  8 oz (240 mL) milk.  8 oz (170 g) fresh fruit or one small piece of fruit.  24 oz (680 g) popped popcorn.  Example of carbohydrate counting Sample meal  3  oz (85 g) chicken breast.  6 oz (170 g) brown rice.  4 oz (113 g) corn.  8 oz (240 mL) milk.  8 oz (170 g) strawberries with sugar-free whipped topping. Carbohydrate calculation 1. Identify the foods that contain carbohydrates: ? Rice. ? Corn. ? Milk. ? Strawberries. 2. Calculate how many servings you have of each food: ? 2 servings rice. ? 1 serving corn. ? 1 serving milk. ? 1 serving strawberries. 3. Multiply each number of servings by 15 g: ? 2 servings rice x 15 g = 30 g. ? 1 serving corn x 15 g = 15 g. ? 1 serving milk x 15 g = 15 g. ? 1 serving strawberries x 15 g = 15 g. 4. Add together all of the amounts to find the total grams of carbohydrates eaten: ? 30 g + 15 g + 15 g + 15 g = 75 g of carbohydrates total. This information is not intended to replace advice given to you by your health care provider. Make sure you discuss any questions you have with your health care provider. Document Released: 08/03/2005 Document Revised: 02/21/2016 Document Reviewed: 01/15/2016 Elsevier Interactive Patient Education  2018 [place] Maintenance, Female Adopting a healthy lifestyle and getting preventive care can go a long way to promote health and wellness. Talk with your health care provider about what schedule of regular examinations is right for you. This is a good chance for you to check in with your provider about disease  prevention and staying healthy. In between checkups, there are plenty of things you can do on your own. Experts have done a lot of research about which lifestyle changes and preventive measures are most likely to keep you healthy. Ask your health care provider for more information. Weight and diet Eat a healthy diet  Be sure to include plenty of vegetables, fruits, low-fat dairy products, and lean protein.  Do not eat a lot of foods high in solid fats, added sugars, or salt.  Get regular exercise. This is one of the most important things you can do  for your health. ? Most adults should exercise for at least 150 minutes each week. The exercise should increase your heart rate and make you sweat (moderate-intensity exercise). ? Most adults should also do strengthening exercises at least twice a week. This is in addition to the moderate-intensity exercise.  Maintain a healthy weight  Body mass index (BMI) is a measurement that can be used to identify possible weight problems. It estimates body fat based on height and weight. Your health care provider can help determine your BMI and help you achieve or maintain a healthy weight.  For females 32 years of age and older: ? A BMI below 18.5 is considered underweight. ? A BMI of 18.5 to 24.9 is normal. ? A BMI of 25 to 29.9 is considered overweight. ? A BMI of 30 and above is considered obese.  Watch levels of cholesterol and blood lipids  You should start having your blood tested for lipids and cholesterol at 41 years of age, then have this test every 5 years.  You may need to have your cholesterol levels checked more often if: ? Your lipid or cholesterol levels are high. ? You are older than 41 years of age. ? You are at high risk for heart disease.  Cancer screening Lung Cancer  Lung cancer screening is recommended for adults 50-36 years old who are at high risk for lung cancer because of a history of smoking.  A yearly low-dose CT scan of the lungs is recommended for people who: ? Currently smoke. ? Have quit within the past 15 years. ? Have at least a 30-pack-year history of smoking. A pack year is smoking an average of one pack of cigarettes a day for 1 year.  Yearly screening should continue until it has been 15 years since you quit.  Yearly screening should stop if you develop a health problem that would prevent you from having lung cancer treatment.  Breast Cancer  Practice breast self-awareness. This means understanding how your breasts normally appear and feel.  It  also means doing regular breast self-exams. Let your health care provider know about any changes, no matter how small.  If you are in your 20s or 30s, you should have a clinical breast exam (CBE) by a health care provider every 1-3 years as part of a regular health exam.  If you are 64 or older, have a CBE every year. Also consider having a breast X-ray (mammogram) every year.  If you have a family history of breast cancer, talk to your health care provider about genetic screening.  If you are at high risk for breast cancer, talk to your health care provider about having an MRI and a mammogram every year.  Breast cancer gene (BRCA) assessment is recommended for women who have family members with BRCA-related cancers. BRCA-related cancers include: ? Breast. ? Ovarian. ? Tubal. ? Peritoneal cancers.  Results of the  assessment will determine the need for genetic counseling and BRCA1 and BRCA2 testing.  Cervical Cancer Your health care provider may recommend that you be screened regularly for cancer of the pelvic organs (ovaries, uterus, and vagina). This screening involves a pelvic examination, including checking for microscopic changes to the surface of your cervix (Pap test). You may be encouraged to have this screening done every 3 years, beginning at age 41.  For women ages 27-65, health care providers may recommend pelvic exams and Pap testing every 3 years, or they may recommend the Pap and pelvic exam, combined with testing for human papilloma virus (HPV), every 5 years. Some types of HPV increase your risk of cervical cancer. Testing for HPV may also be done on women of any age with unclear Pap test results.  Other health care providers may not recommend any screening for nonpregnant women who are considered low risk for pelvic cancer and who do not have symptoms. Ask your health care provider if a screening pelvic exam is right for you.  If you have had past treatment for cervical  cancer or a condition that could lead to cancer, you need Pap tests and screening for cancer for at least 20 years after your treatment. If Pap tests have been discontinued, your risk factors (such as having a new sexual partner) need to be reassessed to determine if screening should resume. Some women have medical problems that increase the chance of getting cervical cancer. In these cases, your health care provider may recommend more frequent screening and Pap tests.  Colorectal Cancer  This type of cancer can be detected and often prevented.  Routine colorectal cancer screening usually begins at 41 years of age and continues through 41 years of age.  Your health care provider may recommend screening at an earlier age if you have risk factors for colon cancer.  Your health care provider may also recommend using home test kits to check for hidden blood in the stool.  A small camera at the end of a tube can be used to examine your colon directly (sigmoidoscopy or colonoscopy). This is done to check for the earliest forms of colorectal cancer.  Routine screening usually begins at age 36.  Direct examination of the colon should be repeated every 5-10 years through 41 years of age. However, you may need to be screened more often if early forms of precancerous polyps or small growths are found.  Skin Cancer  Check your skin from head to toe regularly.  Tell your health care provider about any new moles or changes in moles, especially if there is a change in a mole's shape or color.  Also tell your health care provider if you have a mole that is larger than the size of a pencil eraser.  Always use sunscreen. Apply sunscreen liberally and repeatedly throughout the day.  Protect yourself by wearing long sleeves, pants, a wide-brimmed hat, and sunglasses whenever you are outside.  Heart disease, diabetes, and high blood pressure  High blood pressure causes heart disease and increases the risk  of stroke. High blood pressure is more likely to develop in: ? People who have blood pressure in the high end of the normal range (130-139/85-89 mm Hg). ? People who are overweight or obese. ? People who are African American.  If you are 22-56 years of age, have your blood pressure checked every 3-5 years. If you are 42 years of age or older, have your blood pressure checked every year.  You should have your blood pressure measured twice-once when you are at a hospital or clinic, and once when you are not at a hospital or clinic. Record the average of the two measurements. To check your blood pressure when you are not at a hospital or clinic, you can use: ? An automated blood pressure machine at a pharmacy. ? A home blood pressure monitor.  If you are between 43 years and 38 years old, ask your health care provider if you should take aspirin to prevent strokes.  Have regular diabetes screenings. This involves taking a blood sample to check your fasting blood sugar level. ? If you are at a normal weight and have a low risk for diabetes, have this test once every three years after 41 years of age. ? If you are overweight and have a high risk for diabetes, consider being tested at a younger age or more often. Preventing infection Hepatitis B  If you have a higher risk for hepatitis B, you should be screened for this virus. You are considered at high risk for hepatitis B if: ? You were born in a country where hepatitis B is common. Ask your health care provider which countries are considered high risk. ? Your parents were born in a high-risk country, and you have not been immunized against hepatitis B (hepatitis B vaccine). ? You have HIV or AIDS. ? You use needles to inject street drugs. ? You live with someone who has hepatitis B. ? You have had sex with someone who has hepatitis B. ? You get hemodialysis treatment. ? You take certain medicines for conditions, including cancer, organ  transplantation, and autoimmune conditions.  Hepatitis C  Blood testing is recommended for: ? Everyone born from [birth year] through 1965. ? Anyone with known risk factors for hepatitis C.  Sexually transmitted infections (STIs)  You should be screened for sexually transmitted infections (STIs) including gonorrhea and chlamydia if: ? You are sexually active and are younger than 41 years of age. ? You are older than 41 years of age and your health care provider tells you that you are at risk for this type of infection. ? Your sexual activity has changed since you were last screened and you are at an increased risk for chlamydia or gonorrhea. Ask your health care provider if you are at risk.  If you do not have HIV, but are at risk, it may be recommended that you take a prescription medicine daily to prevent HIV infection. This is called pre-exposure prophylaxis (PrEP). You are considered at risk if: ? You are sexually active and do not regularly use condoms or know the HIV status of your partner(s). ? You take drugs by injection. ? You are sexually active with a partner who has HIV.  Talk with your health care provider about whether you are at high risk of being infected with HIV. If you choose to begin PrEP, you should first be tested for HIV. You should then be tested every 3 months for as long as you are taking PrEP. Pregnancy  If you are premenopausal and you may become pregnant, ask your health care provider about preconception counseling.  If you may become pregnant, take 400 to 800 micrograms (mcg) of folic acid every day.  If you want to prevent pregnancy, talk to your health care provider about birth control (contraception). Osteoporosis and menopause  Osteoporosis is a disease in which the bones lose minerals and strength with aging. This can result in serious  bone fractures. Your risk for osteoporosis can be identified using a bone density scan.  If you are 22 years of age or  older, or if you are at risk for osteoporosis and fractures, ask your health care provider if you should be screened.  Ask your health care provider whether you should take a calcium or vitamin D supplement to lower your risk for osteoporosis.  Menopause may have certain physical symptoms and risks.  Hormone replacement therapy may reduce some of these symptoms and risks. Talk to your health care provider about whether hormone replacement therapy is right for you. Follow these instructions at home:  Schedule regular health, dental, and eye exams.  Stay current with your immunizations.  Do not use any tobacco products including cigarettes, chewing tobacco, or electronic cigarettes.  If you are pregnant, do not drink alcohol.  If you are breastfeeding, limit how much and how often you drink alcohol.  Limit alcohol intake to no more than 1 drink per day for nonpregnant women. One drink equals 12 ounces of beer, 5 ounces of wine, or 1 ounces of hard liquor.  Do not use street drugs.  Do not share needles.  Ask your health care provider for help if you need support or information about quitting drugs.  Tell your health care provider if you often feel depressed.  Tell your health care provider if you have ever been abused or do not feel safe at home. This information is not intended to replace advice given to you by your health care provider. Make sure you discuss any questions you have with your health care provider. Document Released: 02/16/2011 Document Revised: 01/09/2016 Document Reviewed: 05/07/2015 Elsevier Interactive Patient Education  2018 [employer].  Carbohydrate Counting for Diabetes Mellitus, Adult Carbohydrate counting is a method for keeping track of how many carbohydrates you eat. Eating carbohydrates naturally increases the amount of sugar (glucose) in the blood. Counting how many carbohydrates you eat helps keep your blood glucose within normal limits, which helps  you manage your diabetes (diabetes mellitus). It is important to know how many carbohydrates you can safely have in each meal. This is different for every person. A diet and nutrition specialist (registered dietitian) can help you make a meal plan and calculate how many carbohydrates you should have at each meal and snack. Carbohydrates are found in the following foods:  Grains, such as breads and cereals.  Dried beans and soy products.  Starchy vegetables, such as potatoes, peas, and corn.  Fruit and fruit juices.  Milk and yogurt.  Sweets and snack foods, such as cake, cookies, candy, chips, and soft drinks.  How do I count carbohydrates? There are two ways to count carbohydrates in food. You can use either of the methods or a combination of both. Reading "Nutrition Facts" on packaged food The "Nutrition Facts" list is included on the labels of almost all packaged foods and beverages in the [place] It includes:  The serving size.  Information about nutrients in each serving, including the grams (g) of carbohydrate per serving.  To use the "Nutrition Facts":  Decide how many servings you will have.  Multiply the number of servings by the number of carbohydrates per serving.  The resulting number is the total amount of carbohydrates that you will be having.  Learning standard serving sizes of other foods When you eat foods containing carbohydrates that are not packaged or do not include "Nutrition Facts" on the label, you need to measure the servings in  order to count the amount of carbohydrates:  Measure the foods that you will eat with a food scale or measuring cup, if needed.  Decide how many standard-size servings you will eat.  Multiply the number of servings by 15. Most carbohydrate-rich foods have about 15 g of carbohydrates per serving. ? For example, if you eat 8 oz (170 g) of strawberries, you will have eaten 2 servings and 30 g of carbohydrates (2 servings x 15 g = 30  g).  For foods that have more than one food mixed, such as soups and casseroles, you must count the carbohydrates in each food that is included.  The following list contains standard serving sizes of common carbohydrate-rich foods. Each of these servings has about 15 g of carbohydrates:   hamburger bun or  English muffin.   oz (15 mL) syrup.   oz (14 g) jelly.  1 slice of bread.  1 six-inch tortilla.  3 oz (85 g) cooked rice or pasta.  4 oz (113 g) cooked dried beans.  4 oz (113 g) starchy vegetable, such as peas, corn, or potatoes.  4 oz (113 g) hot cereal.  4 oz (113 g) mashed potatoes or  of a large baked potato.  4 oz (113 g) canned or frozen fruit.  4 oz (120 mL) fruit juice.  4-6 crackers.  6 chicken nuggets.  6 oz (170 g) unsweetened dry cereal.  6 oz (170 g) plain fat-free yogurt or yogurt sweetened with artificial sweeteners.  8 oz (240 mL) milk.  8 oz (170 g) fresh fruit or one small piece of fruit.  24 oz (680 g) popped popcorn.  Example of carbohydrate counting Sample meal  3 oz (85 g) chicken breast.  6 oz (170 g) brown rice.  4 oz (113 g) corn.  8 oz (240 mL) milk.  8 oz (170 g) strawberries with sugar-free whipped topping. Carbohydrate calculation 5. Identify the foods that contain carbohydrates: ? Rice. ? Corn. ? Milk. ? Strawberries. 6. Calculate how many servings you have of each food: ? 2 servings rice. ? 1 serving corn. ? 1 serving milk. ? 1 serving strawberries. 7. Multiply each number of servings by 15 g: ? 2 servings rice x 15 g = 30 g. ? 1 serving corn x 15 g = 15 g. ? 1 serving milk x 15 g = 15 g. ? 1 serving strawberries x 15 g = 15 g. 8. Add together all of the amounts to find the total grams of carbohydrates eaten: ? 30 g + 15 g + 15 g + 15 g = 75 g of carbohydrates total. This information is not intended to replace advice given to you by your health care provider. Make sure you discuss any questions you have  with your health care provider. Document Released: 08/03/2005 Document Revised: 02/21/2016 Document Reviewed: 01/15/2016 Elsevier Interactive Patient Education  [employer].

## 2017-06-29 LAB — LIPID PANEL
Cholesterol: 200 mg/dL — ABNORMAL HIGH (ref <200)
HDL: 65 mg/dL (ref >50)
LDL CHOLESTEROL (CALC): 111 mg/dL — AB
NON-HDL CHOLESTEROL (CALC): 135 mg/dL — AB (ref <130)
TRIGLYCERIDES: 126 mg/dL (ref <150)
Total CHOL/HDL Ratio: 3.1 (calc) (ref <5.0)

## 2017-06-29 LAB — CBC WITH DIFFERENTIAL/PLATELET
BASOS PCT: 0.5 %
Basophils Absolute: 31 cells/uL (ref 0–200)
Eosinophils Absolute: 68 cells/uL (ref 15–500)
Eosinophils Relative: 1.1 %
HEMATOCRIT: 39.8 % (ref 35.0–45.0)
HEMOGLOBIN: 13 g/dL (ref 11.7–15.5)
LYMPHS ABS: 2046 {cells}/uL (ref 850–3900)
MCH: 27 pg (ref 27.0–33.0)
MCHC: 32.7 g/dL (ref 32.0–36.0)
MCV: 82.6 fL (ref 80.0–100.0)
MPV: 9.5 fL (ref 7.5–12.5)
Monocytes Relative: 9.3 %
Neutro Abs: 3478 cells/uL (ref 1500–7800)
Neutrophils Relative %: 56.1 %
Platelets: 318 10*3/uL (ref 140–400)
RBC: 4.82 10*6/uL (ref 3.80–5.10)
RDW: 11.7 % (ref 11.0–15.0)
Total Lymphocyte: 33 %
WBC: 6.2 10*3/uL (ref 3.8–10.8)
WBCMIX: 577 {cells}/uL (ref 200–950)

## 2017-06-29 LAB — HEMOGLOBIN A1C
HEMOGLOBIN A1C: 5.9 %{Hb} — AB (ref <5.7)
MEAN PLASMA GLUCOSE: 123 (calc)
eAG (mmol/L): 6.8 (calc)

## 2017-06-30 LAB — PAP, TP IMAGING W/ HPV RNA, RFLX HPV TYPE 16,18/45: HPV DNA HIGH RISK: NOT DETECTED

## 2018-03-30 ENCOUNTER — Other Ambulatory Visit: Payer: Self-pay | Admitting: Radiology

## 2018-03-31 ENCOUNTER — Encounter: Payer: Self-pay | Admitting: Women's Health

## 2018-04-01 ENCOUNTER — Encounter: Payer: Self-pay | Admitting: *Deleted

## 2018-04-01 ENCOUNTER — Telehealth: Payer: Self-pay | Admitting: Hematology

## 2018-04-01 DIAGNOSIS — D0511 Intraductal carcinoma in situ of right breast: Secondary | ICD-10-CM | POA: Insufficient documentation

## 2018-04-01 NOTE — Telephone Encounter (Signed)
Spoke to patient to confirm afternoon [facility] appointment for 8/21, patient is a [facility] patient no packet sent but appointment reminder was sent through email

## 2018-04-04 NOTE — Progress Notes (Signed)
[place]   [phone] Fax:([phone]) [place] Note   Patient Care Team: Huel Cote, NP as PCP - General (Obstetrics and Gynecology) Fanny Skates, MD as Consulting Physician (General Surgery) Truitt Merle, MD as Consulting Physician (Hematology) Eppie Gibson, MD as Attending Physician (Radiation Oncology) 04/06/2018  CHIEF COMPLAINTS/PURPOSE OF CONSULTATION:  Newly diagnosed DCIS  Oncology History   Cancer Staging Ductal carcinoma in situ (DCIS) of right breast Staging form: Breast, AJCC 8th Edition - Clinical stage from 03/30/2018: Stage 0 (cTis (DCIS), cN0, cM0, ER+, PR+, HER2: Not Assessed) - Signed by Truitt Merle, MD on 04/05/2018       Ductal carcinoma in situ (DCIS) of right breast   04/05/2017 Mammogram    04/05/2017 Diagnostic Mammogram  New 5 mm oval nodule in the left breast    04/05/2017 Breast US    04/05/2018 Breast US Likely benign 32m lobulated nodule in the left breast    03/28/2018 Mammogram    Diagnostic mammogram and breast [facility] on 03/28/2018, that revealed a suspicious 7 mm oval nodule in the left breast    03/30/2018 Cancer Staging    Staging form: Breast, AJCC 8th Edition - Clinical stage from 03/30/2018: Stage 0 (cTis (DCIS), cN0, cM0, ER+, PR+, HER2: Not Assessed) - Signed by [name] on 04/05/2018    03/30/2018 Receptors her2    ER 95% and PR 20%.     04/01/2018 Initial Diagnosis    Ductal carcinoma in situ (DCIS) of right breast    04/01/2018 Pathology Results    Diagnosis Breast, right, needle core biopsy - DUCTAL CARCINOMA IN SITU WITH CALCIFICATIONS. - FIBROADENOMA     HISTORY OF PRESENTING ILLNESS:  [name] 42y.o. female is here because of newly diagnosed DCIS. She had a diagnostic mammogram and breast [facility] on 03/28/2018, that revealed a suspicious 7 mm oval nodule in the left breast. Biopsy confirmed DCIS. She previously had a 767mleft breast nodule found on 04/05/2017 that was  said to be likely benign.   Today, she is accompanied by her husband today. She is feeling well today. She denies pain. She is a little anxious.    She has gestational hypertension during her first pregnancy.  She is a tePharmacist, hospitalHad tubal ligation.  She denies family history of breast cancer.   GYN HISTORY  Menarchal: 1243o LMP: 04/06/2018 regular  Contraceptive: Yes HRT: None GP: 2 children. She was 29yo at first delivery    MEDICAL HISTORY:  History reviewed. No pertinent past medical history.  SURGICAL HISTORY: Past Surgical History:  Procedure Laterality Date  . CESAREAN SECTION  2007   PRIMARY LTL, PREECLAMPSIA-UNFAVORABLE  . CESAREAN SECTION  08-27-09  . COLPOSCOPY    . HEMATOMA EVACUATION  2006   RIGHT LEG  . TUBAL LIGATION      SOCIAL HISTORY: Social History   Socioeconomic History  . Marital status: Married    Spouse name: Not on file  . Number of children: Not on file  . Years of education: Not on file  . Highest education level: Not on file  Occupational History  . Not on file  Social Needs  . Financial resource strain: Not on file  . Food insecurity:    Worry: Not on file    Inability: Not on file  . Transportation needs:    Medical: Not on file    Non-medical: Not on file  Tobacco Use  . Smoking  status: Never Smoker  . Smokeless tobacco: Never Used  Substance and Sexual Activity  . Alcohol use: Not Currently    Alcohol/week: 0.0 standard drinks  . Drug use: No  . Sexual activity: Yes    Birth control/protection: Surgical    Comment: TUBAL LIGATION  Lifestyle  . Physical activity:    Days per week: Not on file    Minutes per session: Not on file  . Stress: Not on file  Relationships  . Social connections:    Talks on phone: Not on file    Gets together: Not on file    Attends religious service: Not on file    Active member of club or organization: Not on file    Attends meetings of clubs or organizations: Not on file    Relationship  status: Not on file  . Intimate partner violence:    Fear of current or ex partner: Not on file    Emotionally abused: Not on file    Physically abused: Not on file    Forced sexual activity: Not on file  Other Topics Concern  . Not on file  Social History Narrative  . Not on file    FAMILY HISTORY: Family History  Problem Relation Age of Onset  . Hypertension Mother     ALLERGIES:  has No Known Allergies.  MEDICATIONS:  No current outpatient medications on file.   No current facility-administered medications for this visit.     REVIEW OF SYSTEMS:  Constitutional: Denies fevers, chills or abnormal night sweats Eyes: Denies blurriness of vision, double vision or watery eyes Ears, nose, mouth, throat, and face: Denies mucositis or sore throat Respiratory: Denies cough, dyspnea or wheezes Cardiovascular: Denies palpitation, chest discomfort or lower extremity swelling Gastrointestinal:  Denies nausea, heartburn or change in bowel habits Skin: Denies abnormal skin rashes Lymphatics: Denies new lymphadenopathy or easy bruising Neurological:Denies numbness, tingling or new weaknesses Behavioral/Psych: Mood is stable, no new changes (+) anxious  All other systems were reviewed with the patient and are negative.  PHYSICAL EXAMINATION:  ECOG PERFORMANCE STATUS: 0 - Asymptomatic  Vitals:   04/06/18 1239  BP: (!) 138/97  Pulse: 80  Resp: 18  Temp: 98.1 F (36.7 C)  SpO2: 98%   Filed Weights   04/06/18 1239  Weight: 249 lb 1.6 oz (113 kg)    GENERAL:alert, no distress and comfortable SKIN: skin color, texture, turgor are normal, no rashes or significant lesions EYES: normal, conjunctiva are pink and non-injected, sclera clear OROPHARYNX:no exudate, no erythema and lips, buccal mucosa, and tongue normal  NECK: supple, thyroid normal size, non-tender, without nodularity LYMPH:  no palpable lymphadenopathy in the cervical, axillary or inguinal LUNGS: clear to  auscultation and percussion with normal breathing effort HEART: regular rate & rhythm and no murmurs and no lower extremity edema ABDOMEN:abdomen soft, non-tender and normal bowel sounds Musculoskeletal:no cyanosis of digits and no clubbing  Breast: Healing well after biopsy with a small mass at the biopsy site. No other palpable masses, nipple retraction, or discharge.  PSYCH: alert & oriented x 3 with fluent speech (+) anxious  NEURO: no focal motor/sensory deficits  LABORATORY DATA:  I have reviewed the data as listed CBC Latest Ref Rng & Units 04/06/2018 06/28/2017 05/05/2016  WBC 3.9 - 10.3 K/uL 5.2 6.2 7.0  Hemoglobin 11.6 - 15.9 g/dL 12.8 13.0 13.2  Hematocrit 34.8 - 46.6 % 39.9 39.8 41.6  Platelets 145 - 400 K/uL 351 318 304    CMP Latest  Ref Rng & Units 04/06/2018 05/05/2016 03/22/2015  Glucose 70 - 99 mg/dL 115(H) 85 118(H)  BUN 6 - 20 mg/dL 9 11 14   Creatinine 0.44 - 1.00 mg/dL 0.86 0.77 0.76  Sodium 135 - 145 mmol/L 140 139 141  Potassium 3.5 - 5.1 mmol/L 3.9 4.1 3.8  Chloride 98 - 111 mmol/L 103 103 104  CO2 22 - 32 mmol/L 29 28 27   Calcium 8.9 - 10.3 mg/dL 9.6 9.2 9.3  Total Protein 6.5 - 8.1 g/dL 8.0 7.2 6.9  Total Bilirubin 0.3 - 1.2 mg/dL 0.7 0.8 0.5  Alkaline Phos 38 - 126 U/L 106 80 84  AST 15 - 41 U/L 11(L) 12 11  ALT 0 - 44 U/L 13 14 12     PATHOLOGY:   1/83/3582 Surgical Pathology ADDITIONAL INFORMATION: PROGNOSTIC INDICATORS Results: IMMUNOHISTOCHEMICAL AND MORPHOMETRIC ANALYSIS PERFORMED MANUALLY Estrogen Receptor: 95%, POSITIVE, STRONG STAINING INTENSITY Progesterone Receptor: 20%, POSITIVE, STRONG STAINING INTENSITY REFERENCE RANGE ESTROGEN RECEPTOR NEGATIVE 0% POSITIVE =>1% REFERENCE RANGE PROGESTERONE RECEPTOR NEGATIVE 0% POSITIVE =>1% All controls stained appropriately FINAL DIAGNOSIS Diagnosis Breast, right, needle core biopsy - DUCTAL CARCINOMA IN SITU WITH CALCIFICATIONS. - FIBROADENOMA. - SEE COMMENT. Microscopic Comment The carcinoma  appears intermediate grade. Estrogen receptor and progesterone receptor studies will be performed and the results reported separately. The results were called to [facility] on 03/31/2018. (KBL:ecj Specimen Comment In formalin 9:15 Specimen(s) Obtained: Breast, right, needle core biopsy Specimen Clinical Information Calcs moderate suspicion Gross Received in formalin (time in formalin 9:20 a.m., cold ischemic time five minutes) are fragments and cores of soft tan yellow tissue which measure 2.0 x 2.0 x 0.5 cm in aggregate. The specimen is entirely submitted in two cassettes. (GP:gt, 03/17/18) Stain(s) used in Diagnosis: The following stain(s) were used in diagnosing the case: PR-ACIS, ER-ACIS. The control(s) stained appropriately  RADIOGRAPHIC STUDIES: I have personally reviewed the radiological images as listed and agreed with the findings in the report.  03/28/2018 Diagnostic Mammogram  03/28/2018 Breast US   04/05/2017 Diagnostic Mammogram  04/05/2017 Breast US     ASSESSMENT & PLAN:  [name] is a 42 y.o. lovely female presented with screening discovered to DCIS.  1. 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 -I discussed her breast imaging and needle biopsy results with patient and her family members in great detail. -She is a candidate for breast conservation surgery. She has been seen by breast surgeon Dr. Dalbert Batman, who recommends lumpectomy. -We also discussed the COMMIT clinical trial, she is not interested. -Due to her dense breast tissue, we recommend breast MRI for further evaluation.  She is agreeable. -We recommend her to undergo genetic testing to ruled out  inheritable breast cancer due to her Joslyn age, is agreeable. -Her DCIS will be cured by complete surgical resection. Any form of adjuvant therapy is preventive. -I discussed the benefit of adjuvant breast radiation and antiestrogen therapy to reduce her risk of future breast cancer.  I used the [facility] DCIS tomogram, her estimated risk of breast cancer in the next 10 years is 70%, which will reduce to 7% with adjuvant radiation, and 3% with radiation and tamoxifen.  She is interested.  -Given her  strongly positive/negative ER and PR, and her Mcculley age, I recommend her to consider adjuvant tamoxifen, which decrease her risk of future breast cancer by ~40%.  ---The potential side effects, which includes but not limited to, hot flash, skin and vaginal dryness, slightly increased risk of cardiovascular disease and cataract, small risk of thrombosis and endometrial cancer, were discussed with her in great details. Preventive strategies for thrombosis, such as being physically active, using compression stocks, avoid cigarette smoking, etc., were reviewed with her. I also recommend her to follow-up with her gynecologist once a year, and watch for vaginal spotting or abnormal bleeding, as a clinically sign of endometrial cancer, etc. She voiced good understanding, and agrees to proceed. Will start after she completes adjuvant breast radiation. -We also discussed that biopsy may have sampling limitation, we will review her surgical path, to see if she has any invasive carcinoma components. -She will also see Dr. Isidore Moos today, who will explain to her the role of radiation therapy. -We discussed breast cancer surveillance after she completes treatment, Including annual mammogram, breast exam every 6-12 months. -I plan to see her back after she completes adjuvant radiation.  2.Genetics -Due to her Podgorski age, genetic testing is recommended. She denies family history of breast cancer. I discussed with the patient.  -I  will refer   Plan -She will likely have MRI followed by breast lumpectomy soon -Genetic refer -I'll see her back after she completes adjuvant breast radiation.   All questions were answered. The patient knows to call the clinic with any problems, questions or concerns. I spent 40 minutes counseling the patient face to face. The total time spent in the appointment was 45 minutes and more than 50% was on counseling.     Truitt Merle, MD 04/06/2018 5:41 PM  I, Carmon Sails am acting as scribe for Dr. Truitt Merle.  I have reviewed the above documentation for accuracy and completeness, and I agree with the above.

## 2018-04-05 ENCOUNTER — Encounter: Payer: Self-pay | Admitting: Women's Health

## 2018-04-06 ENCOUNTER — Encounter: Payer: Self-pay | Admitting: General Practice

## 2018-04-06 ENCOUNTER — Encounter: Payer: Self-pay | Admitting: Hematology

## 2018-04-06 ENCOUNTER — Ambulatory Visit
Admission: RE | Admit: 2018-04-06 | Discharge: 2018-04-06 | Disposition: A | Discharge status: Home / Self Care | Payer: BC Managed Care – PPO | Source: Ambulatory Visit | Attending: Radiation Oncology | Admitting: Radiation Oncology

## 2018-04-06 ENCOUNTER — Inpatient Hospital Stay: Payer: BC Managed Care – PPO | Attending: Hematology | Admitting: Hematology

## 2018-04-06 ENCOUNTER — Other Ambulatory Visit: Payer: Self-pay | Admitting: *Deleted

## 2018-04-06 ENCOUNTER — Ambulatory Visit: Payer: BC Managed Care – PPO | Admitting: Physical Therapy

## 2018-04-06 ENCOUNTER — Inpatient Hospital Stay: Payer: BC Managed Care – PPO

## 2018-04-06 ENCOUNTER — Encounter: Payer: Self-pay | Admitting: Radiation Oncology

## 2018-04-06 VITALS — BP 138/97 | HR 80 | Temp 98.1°F | Resp 18 | Ht 66.0 in | Wt 249.1 lb

## 2018-04-06 DIAGNOSIS — D0511 Intraductal carcinoma in situ of right breast: Secondary | ICD-10-CM | POA: Insufficient documentation

## 2018-04-06 DIAGNOSIS — Z170 Estrogen receptor positive status [ER+]: Secondary | ICD-10-CM | POA: Insufficient documentation

## 2018-04-06 LAB — CBC WITH DIFFERENTIAL (CANCER CENTER ONLY)
Basophils Absolute: 0 10*3/uL (ref 0.0–0.1)
Basophils Relative: 1 %
EOS ABS: 0.2 10*3/uL (ref 0.0–0.5)
EOS PCT: 3 %
HCT: 39.9 % (ref 34.8–46.6)
Hemoglobin: 12.8 g/dL (ref 11.6–15.9)
LYMPHS ABS: 1.6 10*3/uL (ref 0.9–3.3)
Lymphocytes Relative: 30 %
MCH: 27.1 pg (ref 25.1–34.0)
MCHC: 32.2 g/dL (ref 31.5–36.0)
MCV: 84.1 fL (ref 79.5–101.0)
MONO ABS: 0.5 10*3/uL (ref 0.1–0.9)
Monocytes Relative: 10 %
Neutro Abs: 2.9 10*3/uL (ref 1.5–6.5)
Neutrophils Relative %: 56 %
PLATELETS: 351 10*3/uL (ref 145–400)
RBC: 4.74 MIL/uL (ref 3.70–5.45)
RDW: 13.4 % (ref 11.2–14.5)
WBC Count: 5.2 10*3/uL (ref 3.9–10.3)

## 2018-04-06 LAB — CMP (CANCER CENTER ONLY)
ALT: 13 U/L (ref 0–44)
ANION GAP: 8 (ref 5–15)
AST: 11 U/L — ABNORMAL LOW (ref 15–41)
Albumin: 4.2 g/dL (ref 3.5–5.0)
Alkaline Phosphatase: 106 U/L (ref 38–126)
BILIRUBIN TOTAL: 0.7 mg/dL (ref 0.3–1.2)
BUN: 9 mg/dL (ref 6–20)
CALCIUM: 9.6 mg/dL (ref 8.9–10.3)
CO2: 29 mmol/L (ref 22–32)
Chloride: 103 mmol/L (ref 98–111)
Creatinine: 0.86 mg/dL (ref 0.44–1.00)
GFR, Estimated: >60 mL/min (ref >60)
Glucose, Bld: 115 mg/dL — ABNORMAL HIGH (ref 70–99)
Potassium: 3.9 mmol/L (ref 3.5–5.1)
SODIUM: 140 mmol/L (ref 135–145)
TOTAL PROTEIN: 8 g/dL (ref 6.5–8.1)

## 2018-04-06 NOTE — Progress Notes (Signed)
Radiation Oncology         [phone] ________________________________  Initial inpatient Consultation  Name: [name] MRN: [id number]  Date: 04/06/2018  DOB: November 19, 1975  CC:Huel Cote, NP  Fanny Skates, MD   REFERRING PHYSICIAN: Fanny Skates, MD  DIAGNOSIS:    ICD-10-CM   1. 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 female who presented with breast abnormality on the following imaging: mammogram on the date of 03-28-18 showed R breast UOQ pleomorphic calcifications in a small area that were new.  Ultrasound of breast on revealed no axillary adenopathy.   Biopsy of right breastshowed intermediate grade DCIS.  ER status: +; PR status +  On review of systems she reports that she is not pregnant currently.  She reports loss of sleep, glasses, contacts and ear pain  PREVIOUS RADIATION THERAPY: No  PAST MEDICAL HISTORY:  has no past medical history on file.    PAST SURGICAL HISTORY: Past Surgical History:  Procedure Laterality Date  . CESAREAN SECTION  2007   PRIMARY LTL, PREECLAMPSIA-UNFAVORABLE  . CESAREAN SECTION  08-27-09  . COLPOSCOPY    . HEMATOMA EVACUATION  2006   RIGHT LEG  . TUBAL LIGATION      FAMILY HISTORY: family history includes Hypertension in her mother.  SOCIAL HISTORY:  reports that she has never smoked. She has never used smokeless tobacco. She reports that she drank alcohol. She reports that she does not use drugs.  ALLERGIES: Patient has no known allergies.  MEDICATIONS:  No current outpatient medications on file.   No current facility-administered medications  for this encounter.     REVIEW OF SYSTEMS: A 10+ POINT REVIEW OF SYSTEMS WAS OBTAINED including neurology, dermatology, psychiatry, cardiac, respiratory, lymph, extremities, GI, GU, Musculoskeletal, constitutional, breasts, reproductive, HEENT.  All pertinent positives are noted in the HPI.  All others are negative.   PHYSICAL EXAM:  Vitals with Age-Percentiles 04/06/2018  Length 643.3 cm  Systolic 295  Diastolic 97  Pulse 80  Respiration 18  Weight 112.991 kg  BMI 40.22  VISIT REPORT    General: Alert and oriented, in no acute distress HEENT: Head is normocephalic. Extraocular movements are intact. Oropharynx is clear. Neck: Neck is supple, no palpable cervical or supraclavicular lymphadenopathy. Heart: Regular in rate and rhythm with no murmurs, rubs, or gallops. Chest: Clear to auscultation bilaterally, with no rhonchi, wheezes, or rales. Abdomen: Soft, nontender, nondistended, with no rigidity or guarding. Extremities: No cyanosis or edema. Lymphatics: see Neck Exam Skin: No concerning lesions. Musculoskeletal: symmetric strength and muscle tone throughout. Neurologic: Cranial nerves II through XII are grossly intact. No obvious focalities. Speech is fluent. Coordination is intact. Psychiatric: Judgment and insight are intact. Affect is appropriate. Breasts: large area of post biopsy swelling in UOQ right breast, several cm in dimension . No other palpable masses appreciated in the breasts or axillae    ECOG = 0  0 - Asymptomatic (Fully active, able to carry on all predisease activities without restriction)  1 - Symptomatic but completely ambulatory (Restricted in physically strenuous activity but ambulatory and able to carry out work  of a light or sedentary nature. For example, light housework, office work)  2 - Symptomatic, <50% in bed during the day (Ambulatory and capable of all self care but unable to carry out any work activities. Up and about more than 50% of waking  hours)  3 - Symptomatic, >50% in bed, but not bedbound (Capable of only limited self-care, confined to bed or chair 50% or more of waking hours)  4 - Bedbound (Completely disabled. Cannot carry on any self-care. Totally confined to bed or chair)  5 - Death   Eustace Pen MM, Creech RH, Tormey DC, et al. [phone]). "Toxicity and response criteria of the [facility]". [place] Oncol. 5 (6): 649-55   LABORATORY DATA:  Lab Results  Component Value Date   WBC 5.2 04/06/2018   HGB 12.8 04/06/2018   HCT 39.9 04/06/2018   MCV 84.1 04/06/2018   PLT 351 04/06/2018   CMP     Component Value Date/Time   NA 140 04/06/2018 1221   K 3.9 04/06/2018 1221   CL 103 04/06/2018 1221   CO2 29 04/06/2018 1221   GLUCOSE 115 (H) 04/06/2018 1221   BUN 9 04/06/2018 1221   CREATININE 0.86 04/06/2018 1221   CREATININE 0.77 05/05/2016 1137   CALCIUM 9.6 04/06/2018 1221   PROT 8.0 04/06/2018 1221   ALBUMIN 4.2 04/06/2018 1221   AST 11 (L) 04/06/2018 1221   ALT 13 04/06/2018 1221   ALKPHOS 106 04/06/2018 1221   BILITOT 0.7 04/06/2018 1221   GFRNONAA >60 04/06/2018 1221   GFRAA >60 04/06/2018 1221         RADIOGRAPHY:  As above    IMPRESSION/PLAN: DCIS of right breast   She has been discussed at our multidisciplinary tumor board.  The consensus is that she would be a good candidate for breast conservation. I talked to her about the option of a mastectomy and informed her that her expected overall survival would be equivalent between mastectomy and breast conservation, based upon randomized controlled data. She is enthusiastic about breast conservation.  It was a pleasure meeting the patient today. We discussed the risks, benefits, and side effects of radiotherapy. I recommend radiotherapy to the right breast to reduce her risk of locoregional recurrence by 1/2.  We discussed that radiation would take approximately 4 weeks to complete and that I would give the patient a few weeks to  heal following surgery before starting treatment planning. We spoke about acute effects including skin irritation and fatigue as well as much less common late effects including internal organ injury or irritation. We spoke about the latest technology that is used to minimize the risk of late effects for patients undergoing radiotherapy to the breast or chest wall. No guarantees of treatment were given. The patient is enthusiastic about proceeding with treatment. I look forward to participating in the patient's care.  I will await her referral back to me for postoperative follow-up and eventual CT simulation/treatment planning.  Note: MRI pending of breasts, genetics referral has been made    __________________________________________   Eppie Gibson, MD

## 2018-04-07 NOTE — Progress Notes (Signed)
[facility] Psychosocial Distress Screening Spiritual Care  Met with Leighla and her husband in [facility] to introduce Support Center team/resources, reviewing distress screen per protocol.  The patient scored a 10 on the Psychosocial Distress Thermometer which indicates severe distress. Also assessed for distress and other psychosocial needs.   ONCBCN DISTRESS SCREENING 04/07/2018  Screening Type Initial Screening  Distress experienced in past week (1-10) 10  Practical problem type Work/school  Emotional problem type Adjusting to illness  Spiritual/Religous concerns type Facing my mortality  Referral to support programs Yes   Cinda reports high distress about dx, as well as some relief after meeting team at [facility]. She is a seventh-grade teacher. She and her husband, who engaged in conversation and reflection with candor and insight, have two boys (7, 12) and enjoy family activities such as going to movies and out to eat. Couple took interest in Support Center programming, including massage therapy. We brainstormed ways to help support their children through dx/tx.  Follow up needed: Yes.  Couple plans to explore support programs. Plan to f/u with Sanaa when she is on campus to offer further support and encouragement. She is aware of Breast Cancer Support Group and [facility] peer mentoring, but plans at this time to pace herself in engaging with others' stories. As always, please also page if immediate needs arise or circumstances change. Thank you.   Chaplain Lisa Lundeen, MDiv, [facility] Pager [phone] Voicemail [phone]      

## 2018-04-08 ENCOUNTER — Telehealth: Payer: Self-pay | Admitting: *Deleted

## 2018-04-08 ENCOUNTER — Telehealth: Payer: Self-pay | Admitting: Hematology

## 2018-04-08 NOTE — Telephone Encounter (Signed)
Spoke to pt concerning [place] from 8.21.19. Denies questions or concerns regarding dx or treatment care plan. Encourage pt to call with needs. Received verbal understanding. Contact information provided.

## 2018-04-08 NOTE — Telephone Encounter (Signed)
No LOS 8/21 °

## 2018-04-12 ENCOUNTER — Inpatient Hospital Stay: Payer: BC Managed Care – PPO

## 2018-04-12 ENCOUNTER — Inpatient Hospital Stay (HOSPITAL_BASED_OUTPATIENT_CLINIC_OR_DEPARTMENT_OTHER): Payer: BC Managed Care – PPO | Admitting: Licensed Clinical Social Worker

## 2018-04-12 ENCOUNTER — Encounter: Payer: Self-pay | Admitting: Licensed Clinical Social Worker

## 2018-04-12 DIAGNOSIS — Z8041 Family history of malignant neoplasm of ovary: Secondary | ICD-10-CM | POA: Insufficient documentation

## 2018-04-12 DIAGNOSIS — D0511 Intraductal carcinoma in situ of right breast: Secondary | ICD-10-CM | POA: Diagnosis not present

## 2018-04-12 DIAGNOSIS — Z170 Estrogen receptor positive status [ER+]: Secondary | ICD-10-CM | POA: Diagnosis not present

## 2018-04-12 NOTE — Progress Notes (Signed)
REFERRING PROVIDER: Truitt Merle, MD [place], [place] [address]  PRIMARY PROVIDER:  Huel Cote, NP  PRIMARY REASON FOR VISIT:  1. Ductal carcinoma in situ (DCIS) of right breast   2. Family history of ovarian cancer      HISTORY OF PRESENT ILLNESS:   [name], a 42 y.o. female, was seen for a [place] cancer genetics consultation at the request of Dr. Burr Medico due to a personal and family history of cancer.  [name] presents to clinic today to discuss the possibility of a hereditary predisposition to cancer, genetic testing, and to further clarify her future cancer risks, as well as potential cancer risks for family members.   In 2019, at the age of 70, [name] was diagnosed with DCIS of the right breast. The current treatment plan is lumpectomy.   CANCER HISTORY:  Oncology History   Cancer Staging Ductal carcinoma in situ (DCIS) of right breast Staging form: Breast, AJCC 8th Edition - Clinical stage from 03/30/2018: Stage 0 (cTis (DCIS), cN0, cM0, ER+, PR+, HER2: Not Assessed) - Signed by Truitt Merle, MD on 04/05/2018       Ductal carcinoma in situ (DCIS) of right breast   04/05/2017 Mammogram    04/05/2017 Diagnostic Mammogram  New 5 mm oval nodule in the left breast    04/05/2017 Breast US    04/05/2018 Breast US Likely benign 38m lobulated nodule in the left breast    03/28/2018 Mammogram    Diagnostic mammogram and breast [facility] on 03/28/2018, that revealed a suspicious 7 mm oval nodule in the left breast    03/30/2018 Cancer Staging    Staging form: Breast, AJCC 8th Edition - Clinical stage from 03/30/2018: Stage 0 (cTis (DCIS), cN0, cM0, ER+, PR+, HER2: Not Assessed) - Signed by Morgan Merle MD on 04/05/2018    03/30/2018 Receptors her2    ER 95% and PR 20%.     04/01/2018 Initial Diagnosis    Ductal carcinoma in situ (DCIS) of right breast    04/01/2018 Pathology Results    Diagnosis Breast, right, needle core biopsy - DUCTAL CARCINOMA IN SITU  WITH CALCIFICATIONS. - FIBROADENOMA      HORMONAL RISK FACTORS:  Menarche was at age 42  First live birth at age 42  OCP use for approximately 11 years.  Ovaries intact: yes.  Hysterectomy: no.  Menopausal status: premenopausal.  HRT use: 0 years. Colonoscopy: no; not examined. Mammogram within the last year: yes. Number of breast biopsies: 1.  Past Medical History:  Diagnosis Date  . Family history of ovarian cancer     Past Surgical History:  Procedure Laterality Date  . CESAREAN SECTION  2007   PRIMARY LTL, PREECLAMPSIA-UNFAVORABLE  . CESAREAN SECTION  08-27-09  . COLPOSCOPY    . HEMATOMA EVACUATION  2006   RIGHT LEG  . TUBAL LIGATION      Social History   Socioeconomic History  . Marital status: Married    Spouse name: Not on file  . Number of children: Not on file  . Years of education: Not on file  . Highest education level: Not on file  Occupational History  . Not on file  Social Needs  . Financial resource strain: Not on file  . Food insecurity:    Worry: Not on file    Inability: Not on file  . Transportation needs:    Medical: Not on file    Non-medical: Not on file  Tobacco Use  .  Smoking status: Never Smoker  . Smokeless tobacco: Never Used  Substance and Sexual Activity  . Alcohol use: Not Currently    Alcohol/week: 0.0 standard drinks  . Drug use: No  . Sexual activity: Yes    Birth control/protection: Surgical    Comment: TUBAL LIGATION  Lifestyle  . Physical activity:    Days per week: Not on file    Minutes per session: Not on file  . Stress: Not on file  Relationships  . Social connections:    Talks on phone: Not on file    Gets together: Not on file    Attends religious service: Not on file    Active member of club or organization: Not on file    Attends meetings of clubs or organizations: Not on file    Relationship status: Not on file  Other Topics Concern  . Not on file  Social History Narrative  . Not on file      FAMILY HISTORY:  We obtained a detailed, 4-generation family history.  Significant diagnoses are listed below: Family History  Problem Relation Age of Onset  . Hypertension Mother   . Ovarian cancer Maternal Aunt 72  . Cancer Maternal Aunt        unknown type    Ms. Catanzaro has two sons, Ellard Artis and Thomasena Edis, ages 69 and 108. She has a half-sister, age 23, through her mother. This sister has three children. She has a half-brother through her mother as well, he is 69 and has four children. No cancer history.  Ms. Wiley reports that she chose to have no relationship with her father, and therefore does not have any information about his side of the family.  Ms. Rothman mother died at 84, no cancer history. She had four sisters and a brother. One of Ms. Maultsby's aunts was diagnosed recently with ovarian cancer at 3. Another aunt passed away from cancer, unknown type. The other two aunts are alive and doing well in their 82's, as is her uncle. Ms. Caporaso has no information about her maternal grandparents.   Ms. Manka is unaware of previous family history of genetic testing for hereditary cancer risks. Patient's maternal ancestors are of African American descent, and paternal ancestors are of African American descent. There is no reported Ashkenazi Jewish ancestry. There is no known consanguinity.  GENETIC COUNSELING ASSESSMENT: [name] is a 42 y.o. female with a personal and family history which is somewhat suggestive of a Hereditary Cancer Predisposition Syndrome. We, therefore, discussed and recommended the following at today's visit.   DISCUSSION: We discussed that about 5-10% of breast cancer cases are hereditary with most cases due to BRCA mutations.  Other genes associated with hereditary breast cancer cases include ATM, CHEK2 and PALB2.  We reviewed the characteristics, features and inheritance patterns of hereditary cancer syndromes. We also discussed genetic testing, including the appropriate  family members to test, the process of testing, insurance coverage and turn-around-time for results. We discussed the implications of a negative, positive and/or variant of uncertain significant result. We recommended the patient pursue genetic testing for the [facility] STAT Breast Cancer Panel, reflex to Common Hereditary Cancers Panel.  The STAT Breast cancer panel offered by [facility] includes sequencing and rearrangement analysis for the following 9 genes:  ATM, BRCA1, BRCA2, CDH1, CHEK2, PALB2, PTEN, STK11 and TP53. The Common Hereditary Cancers Panel offered by [facility] includes sequencing and/or deletion duplication testing of the following 47 genes: APC, ATM, AXIN2, BARD1, BMPR1A, BRCA1, BRCA2, BRIP1,  CDH1, CDKN2A (p14ARF), CDKN2A (p16INK4a), CKD4, CHEK2, CTNNA1, DICER1, EPCAM (Deletion/duplication testing only), GREM1 (promoter region deletion/duplication testing only), KIT, MEN1, MLH1, MSH2, MSH3, MSH6, MUTYH, NBN, NF1, NHTL1, PALB2, PDGFRA, PMS2, POLD1, POLE, PTEN, RAD50, RAD51C, RAD51D, SDHB, SDHC, SDHD, SMAD4, SMARCA4. STK11, TP53, TSC1, TSC2, and VHL.  The following genes were evaluated for sequence changes only: SDHA and HOXB13 c.251G>A variant only.  We discussed that if she is found to have a mutation in one of these genes, it may impact surgical decisions, and alter future medical management recommendations such as increased cancer screenings and consideration of risk reducing surgeries.  A positive result could also have implications for the patient's family members.  A Negative result would mean we were unable to identify a hereditary component to her cancer, but does not rule out the possibility of a hereditary basis for her cancer.  There could be mutations that are undetectable by current technology, or in genes not yet tested or identified to increase cancer risk.    We discussed the potential to find a Variant of Uncertain Significance or VUS.  These are variants that have not yet been  identified as pathogenic or benign, and it is unknown if this variant is associated with increased cancer risk or if this is a normal finding.  Most VUS's are reclassified to benign or likely benign.   It should not be used to make medical management decisions. With time, we suspect the lab will determine the significance of any VUS's identified if any.   Based on Ms. Ishmael's personal history of breast cancer at 52 and her maternal aunt's ovarian cancer, she meets medical criteria for genetic testing. Despite that she meets criteria, she may still have an out of pocket cost.   PLAN: After considering the risks, benefits, and limitations, Ms. Pelley  provided informed consent to pursue genetic testing and the blood sample was sent to [facility] for analysis of the STAT Breast Cancer Panel + Common Hereditary Cancers Panel. Results should be available within approximately 5-12 days' time, at which point they will be disclosed by telephone to Ms. Butterbaugh, as will any additional recommendations warranted by these results. Ms. Gillott will receive a summary of her genetic counseling visit and a copy of her results once available. This information will also be available in Epic. We encouraged Ms. Goede to remain in contact with cancer genetics annually so that we can continuously update the family history and inform her of any changes in cancer genetics and testing that may be of benefit for her family. Ms. Tippy questions were answered to her satisfaction today. Our contact information was provided should additional questions or concerns arise.  Based on Ms. Altier's family history, we recommended her aunt, have genetic counseling and testing if she has not already had these services. Ms. Ems will let us know if we can be of any assistance in coordinating genetic counseling and/or testing for this family member.   Lastly, we encouraged Ms. Haseley to remain in contact with cancer genetics annually so that  we can continuously update the family history and inform her of any changes in cancer genetics and testing that may be of benefit for this family.   Ms.  Abadi questions were answered to her satisfaction today. Our contact information was provided should additional questions or concerns arise. Thank you for the referral and allowing [place] to share in the care of your patient.   Epimenio Foot, MS Genetic Counselor [email or web address] Phone: [phone]  The patient was seen for a total of 35 minutes in face-to-face genetic counseling.  The patient was accompanied today by her son, Ellard Artis. This patient was discussed with Drs. Magrinat, Lindi Adie and/or Burr Medico who agrees with the above.

## 2018-04-15 ENCOUNTER — Ambulatory Visit
Admission: RE | Admit: 2018-04-15 | Discharge: 2018-04-15 | Disposition: A | Discharge status: Home / Self Care | Payer: BC Managed Care – PPO | Source: Ambulatory Visit | Attending: Hematology | Admitting: Hematology

## 2018-04-15 DIAGNOSIS — D0511 Intraductal carcinoma in situ of right breast: Secondary | ICD-10-CM

## 2018-04-15 MED ORDER — GADOBENATE DIMEGLUMINE 529 MG/ML IV SOLN
20.0000 mL | Freq: Once | INTRAVENOUS | Status: AC | PRN
  Administered 2018-04-15: 20 mL via INTRAVENOUS

## 2018-04-21 ENCOUNTER — Other Ambulatory Visit: Payer: Self-pay | Admitting: Hematology

## 2018-04-21 DIAGNOSIS — R9389 Abnormal findings on diagnostic imaging of other specified body structures: Secondary | ICD-10-CM

## 2018-04-28 ENCOUNTER — Encounter: Payer: Self-pay | Admitting: Licensed Clinical Social Worker

## 2018-04-28 ENCOUNTER — Telehealth: Payer: Self-pay | Admitting: Licensed Clinical Social Worker

## 2018-04-28 DIAGNOSIS — Z1379 Encounter for other screening for genetic and chromosomal anomalies: Secondary | ICD-10-CM | POA: Insufficient documentation

## 2018-04-28 NOTE — Telephone Encounter (Signed)
Revealed negative genetic testing on the initial STAT Breast Cancer panel, let [name] know we are still waiting on the rest of the results to come back and once they do I'll call again.

## 2018-05-02 ENCOUNTER — Encounter: Payer: Self-pay | Admitting: Licensed Clinical Social Worker

## 2018-05-02 ENCOUNTER — Ambulatory Visit
Admission: RE | Admit: 2018-05-02 | Discharge: 2018-05-02 | Disposition: A | Discharge status: Home / Self Care | Payer: BC Managed Care – PPO | Source: Ambulatory Visit | Attending: Hematology | Admitting: Hematology

## 2018-05-02 ENCOUNTER — Ambulatory Visit: Payer: Self-pay | Admitting: Licensed Clinical Social Worker

## 2018-05-02 ENCOUNTER — Telehealth: Payer: Self-pay | Admitting: Licensed Clinical Social Worker

## 2018-05-02 DIAGNOSIS — Z8041 Family history of malignant neoplasm of ovary: Secondary | ICD-10-CM

## 2018-05-02 DIAGNOSIS — D0511 Intraductal carcinoma in situ of right breast: Secondary | ICD-10-CM

## 2018-05-02 DIAGNOSIS — R9389 Abnormal findings on diagnostic imaging of other specified body structures: Secondary | ICD-10-CM

## 2018-05-02 DIAGNOSIS — Z1379 Encounter for other screening for genetic and chromosomal anomalies: Secondary | ICD-10-CM

## 2018-05-02 MED ORDER — GADOBUTROL 1 MMOL/ML IV SOLN
10.0000 mL | Freq: Once | INTRAVENOUS | Status: AC | PRN
  Administered 2018-05-02: 10 mL via INTRAVENOUS

## 2018-05-02 NOTE — Telephone Encounter (Signed)
Revealed negative genetic testing on the Common Hereditary Cancers Panel.  Revealed that a VUS in MSH6 was identified.   This normal result is reassuring and indicates that it is unlikely [name]'s cancer is due to a hereditary cause.  It is unlikely that there is an increased risk of another cancer due to a mutation in one of these genes.  However, genetic testing is not perfect, and cannot definitively rule out a hereditary cause.  It will be important for her to keep in contact with genetics to learn if any additional testing may be needed in the future. Discussed that her aunt with ovarian cancer history could have genetic counseling and testing.

## 2018-05-02 NOTE — Progress Notes (Signed)
HPI:  [name] was previously seen in the [facility] clinic on 04/12/2018 due to a recent diagnosis of breast cancer, family history of ovarian cancer and concerns regarding a hereditary predisposition to cancer. Please refer to our prior cancer genetics clinic note for more information regarding [name]'s medical, social and family histories, and our assessment and recommendations, at the time. [name] recent genetic test results were disclosed to her, as well as recommendations warranted by these results. These results and recommendations are discussed in more detail below.  CANCER HISTORY:  Oncology History   Cancer Staging Ductal carcinoma in situ (DCIS) of right breast Staging form: Breast, AJCC 8th Edition - Clinical stage from 03/30/2018: Stage 0 (cTis (DCIS), cN0, cM0, ER+, PR+, HER2: Not Assessed) - Signed by Truitt Merle, MD on 04/05/2018       Ductal carcinoma in situ (DCIS) of right breast   04/05/2017 Mammogram    04/05/2017 Diagnostic Mammogram  New 5 mm oval nodule in the left breast    04/05/2017 Breast US    04/05/2018 Breast US Likely benign 58m lobulated nodule in the left breast    03/28/2018 Mammogram    Diagnostic mammogram and breast [facility] on 03/28/2018, that revealed a suspicious 7 mm oval nodule in the left breast    03/30/2018 Cancer Staging    Staging form: Breast, AJCC 8th Edition - Clinical stage from 03/30/2018: Stage 0 (cTis (DCIS), cN0, cM0, ER+, PR+, HER2: Not Assessed) - Signed by [name] on 04/05/2018    03/30/2018 Receptors her2    ER 95% and PR 20%.     04/01/2018 Initial Diagnosis    Ductal carcinoma in situ (DCIS) of right breast    04/01/2018 Pathology Results    Diagnosis Breast, right, needle core biopsy - DUCTAL CARCINOMA IN SITU WITH CALCIFICATIONS. - FIBROADENOMA     Genetic Testing    Negative genetic testing on the [facility] Breast Cancer STAT panel + Common Hereditary Cancers Panel. The STAT Breast cancer panel  offered by [facility] includes sequencing and rearrangement analysis for the following 9 genes:  ATM, BRCA1, BRCA2, CDH1, CHEK2, PALB2, PTEN, STK11 and TP53. The Common Hereditary Cancers Panel offered by [facility] includes sequencing and/or deletion duplication testing of the following 47 genes: APC, ATM, AXIN2, BARD1, BMPR1A, BRCA1, BRCA2, BRIP1, CDH1, CDKN2A (p14ARF), CDKN2A (p16INK4a), CKD4, CHEK2, CTNNA1, DICER1, EPCAM (Deletion/duplication testing only), GREM1 (promoter region deletion/duplication testing only), KIT, MEN1, MLH1, MSH2, MSH3, MSH6, MUTYH, NBN, NF1, NHTL1, PALB2, PDGFRA, PMS2, POLD1, POLE, PTEN, RAD50, RAD51C, RAD51D, SDHB, SDHC, SDHD, SMAD4, SMARCA4. STK11, TP53, TSC1, TSC2, and VHL.  The following genes were evaluated for sequence changes only: SDHA and HOXB13 c.251G>A variant only.   Genetic testing did detect a Variant of Unknown Significance in the MSH6 gene called c.650A>G.  At this time, it is unknown if this variant is associated with increased cancer risk or if this is a normal finding, but most variants such as this get reclassified to being inconsequential. It should not be used to make medical management decisions.   The report date is 05/02/2018.      FAMILY HISTORY:  We obtained a detailed, 4-generation family history.  Significant diagnoses are listed below: Family History  Problem Relation Age of Onset  . Hypertension Mother   . Ovarian cancer Maternal Aunt 72  . Cancer Maternal Aunt        unknown type   [name] has two sons, [name] [name] ages 15  and 7. She has a half-sister, age 65, through her mother. This sister has three children. She has a half-brother through her mother as well, he is 30 and has four children. No cancer history.  [name] reports that she chose to have no relationship with her father, and therefore does not have any information about his side of the family.  [name] mother died at 43, no cancer history. She had four sisters and a  brother. One of [name]'s aunts was diagnosed recently with ovarian cancer at 44. Another aunt passed away from cancer, unknown type. The other two aunts are alive and doing well in their 24's, as is her uncle. [name] has no information about her maternal grandparents.   [name] is unaware of previous family history of genetic testing for hereditary cancer risks. Patient's maternal ancestors are of African American descent, and paternal ancestors are of African American descent. There is no reported Ashkenazi Jewish ancestry. There is no known consanguinity.  GENETIC TEST RESULTS: Genetic testing performed through [facility]'s Breast Cancer STAT Panel reported out on 04/25/2018 showed no pathogenic mutations. The STAT Breast cancer panel offered by [facility] includes sequencing and rearrangement analysis for the following 9 genes:  ATM, BRCA1, BRCA2, CDH1, CHEK2, PALB2, PTEN, STK11 and TP53.    Genetic testing performed through [facility]'s Common Hereditary Cancers Panel reported out on 05/02/2018 showed no pathogenic mutations. The Common Hereditary Cancers Panel offered by [facility] includes sequencing and/or deletion duplication testing of the following 47 genes: APC, ATM, AXIN2, BARD1, BMPR1A, BRCA1, BRCA2, BRIP1, CDH1, CDKN2A (p14ARF), CDKN2A (p16INK4a), CKD4, CHEK2, CTNNA1, DICER1, EPCAM (Deletion/duplication testing only), GREM1 (promoter region deletion/duplication testing only), KIT, MEN1, MLH1, MSH2, MSH3, MSH6, MUTYH, NBN, NF1, NHTL1, PALB2, PDGFRA, PMS2, POLD1, POLE, PTEN, RAD50, RAD51C, RAD51D, SDHB, SDHC, SDHD, SMAD4, SMARCA4. STK11, TP53, TSC1, TSC2, and VHL.  The following genes were evaluated for sequence changes only: SDHA and HOXB13 c.251G>A variant only.  A variant of uncertain significance (VUS) in a gene called MSH6 was also noted.   The test report will be scanned into EPIC and will be located under the Molecular Pathology section of the Results Review tab. A portion of the result report  is included below for reference.     We discussed with [name] that because current genetic testing is not perfect, it is possible there may be a gene mutation in one of these genes that current testing cannot detect, but that chance is small.  We also discussed, that there could be another gene that has not yet been discovered, or that we have not yet tested, that is responsible for the cancer diagnoses in the family. It is also possible there is a hereditary cause for the cancer in the family that [name] did not inherit and therefore was not identified in her testing.  Therefore, it is important to remain in touch with cancer genetics in the future so that we can continue to offer [name] the most up to date genetic testing.   Regarding the VUS in MSH6: At this time, it is unknown if this variant is associated with increased cancer risk or if this is a normal finding, but most variants such as this get reclassified to being inconsequential. It should not be used to make medical management decisions. With time, we suspect the lab will determine the significance of this variant, if any. If we do learn more about it, we will try to contact [name] to discuss it  further. However, it is important to stay in touch with [place] periodically and keep the address and phone number up to date.  ADDITIONAL GENETIC TESTING: We discussed with [name] that there are other genes that are associated with increased cancer risk that can be analyzed. The laboratories that offer this testing look at these additional genes via a hereditary cancer gene panel. Should [name] wish to pursue additional genetic testing, we are happy to discuss and coordinate this testing, at any time.    CANCER SCREENING RECOMMENDATIONS: [name] test result is considered negative (normal).  This means that we have not identified a hereditary cause for her personal and family history of cancer at this time.   This normal indicates  that it is unlikely [name]. Dail has an increased risk of cancer due to a mutation in one of these genes. While reassuring, this does not definitively rule out a hereditary predisposition to cancer. It is still possible that there could be genetic mutations that are undetectable by current technology, or genetic mutations in genes that have not been tested or identified to increase cancer risk.  Therefore, it is recommended she continue to follow the cancer management and screening guidelines provided by her oncology and primary healthcare provider. An individual's cancer risk is not determined by genetic test results alone.  Overall cancer risk assessment includes additional factors such as personal medical history, family history, etc.  These should be used to make a personalized plan for cancer prevention and surveillance.    RECOMMENDATIONS FOR FAMILY MEMBERS:  Relatives in this family might be at some increased risk of developing cancer, over the general population risk, simply due to the family history of cancer.  We recommended women in this family have a yearly mammogram beginning at age 24, or 71 years younger than the earliest onset of cancer, an annual clinical breast exam, and perform monthly breast self-exams. Women in this family should also have a gynecological exam as recommended by their primary provider. All family members should have a colonoscopy by age 21 (or as directed by their doctors).  All family members should inform their physicians about the family history of cancer so their doctors can make the most appropriate screening recommendations for them.   It is also possible there is a hereditary cause for the cancer in [name]. Erhardt's family that she did not inherit and therefore was not identified in her.  We recommended her maternal aunt with ovarian cancer have genetic counseling and testing. [name]. Carvin will let us know if we can be of any assistance in coordinating genetic counseling and/or  testing for these family members.   FOLLOW-UP: Lastly, we discussed with [name]. Pevey that cancer genetics is a rapidly advancing field and it is possible that new genetic tests will be appropriate for her and/or her family members in the future. We encouraged her to remain in contact with cancer genetics on an annual basis so we can update her personal and family histories and let her know of advances in cancer genetics that may benefit this family.   Our contact number was provided. [name]. Ulibarri questions were answered to her satisfaction, and she knows she is welcome to call us at anytime with additional questions or concerns.   Epimenio Foot, [name] Genetic Counselor [email or web address] Phone: [phone]

## 2018-05-11 ENCOUNTER — Other Ambulatory Visit: Payer: Self-pay

## 2018-05-16 ENCOUNTER — Encounter: Payer: Self-pay | Admitting: Women's Health

## 2018-05-26 ENCOUNTER — Other Ambulatory Visit: Payer: Self-pay | Admitting: General Surgery

## 2018-05-26 DIAGNOSIS — C50111 Malignant neoplasm of central portion of right female breast: Secondary | ICD-10-CM

## 2018-05-26 DIAGNOSIS — Z170 Estrogen receptor positive status [ER+]: Principal | ICD-10-CM

## 2018-05-27 ENCOUNTER — Other Ambulatory Visit: Payer: Self-pay | Admitting: *Deleted

## 2018-05-27 DIAGNOSIS — D0511 Intraductal carcinoma in situ of right breast: Secondary | ICD-10-CM

## 2018-05-30 ENCOUNTER — Encounter: Payer: Self-pay | Admitting: Radiation Oncology

## 2018-06-08 NOTE — H&P (Signed)
  Subjective:     Patient ID: [name] is a 42 y.o. female.  HPI Here for follow up discussion prior to planned right mastectomy. Presented following screening MMG with calcifications RIGHT breast and known 7 mm mass left breast that was stable in size since 2018. Biopsy right breast calcs revealed intermediate grade DCIS, ER/PR +.  MRI demonstrated biopsy-proven DCIS within the right UOQ. Additional linear NME spanned 10 cm extent. MRI-guided biopsies at the posterior and anterior extents of this enhancement demonstrated DCIS. Given span of disease mastectomy recommended. Dr. Dalbert Batman does not recommend NSM due to extent.  Two LEFT breast biopsies to date: "left lower outer" with intraductal papilloma, "left 10 o clock 6cmfn" with fibroadenoma. Plan excision of papilloma.  Genetics negative.  Current 38D. Wt stable.  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      Objective:   Physical Exam  Cardiovascular: Normal rate, regular rhythm and normal heart sounds.  Pulmonary/Chest: Effort normal and breath sounds normal.  Lymphadenopathy:    She has no axillary adenopathy.   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 Reviewed anchor type scar.  Reviewed incisions, drains, OR length, hospital stay, post op limitations, and recovery. Reviewed process of expansion and implant based risks including rupture, MRI surveillance for silicone implants, infection requiring surgery or removal, contracture. Discussed asymmetry one can expect with implant unilateral and natural breast on opposite. Reviewed reconstruction will be  asensate. Reviewed risks mastectomy flap necrosis requiring additional surgery.   Discussed use of acellular dermis in reconstruction, cadaveric source, incorporation over several weeks, risk that if has seroma or infection can act as additional nidus for infection if not incorporated.  Discussed prepectoral vs sub pectoral reconstruction. Discussed with patient and benefit of this is no animation deformity, may be less pain. Risk may be more visible rippling over upper poles, greater need of ADM. Reviewed pre pectoral would require larger amount acellular dermis, more drains. Discussed any type reconstruction also risks long term displacement implant and visible rippling. If prepectoral counseled I would recommend she be comfortable with silicone implants as more options that have less rippling. She agrees to prepectoral placement.  Additional risks including but not limited to bleeding, seroma, hematoma, damage to deeper structures, wound healing problems, DVT/PE, cardiopulmonary complications, need for additional surgery reviewed.  Irene Limbo, MD [facility] Plastic & Reconstructive Surgery [phone], pin [phone]

## 2018-06-12 NOTE — H&P (Signed)
[name] Location: [facility] Patient #: [id number] DOB: [birth year] Married / Language: English / Race: Black or African American Female       History of Present Illness     . This is a very pleasant 42 year old female who returns for definitive scheduling of her breast surgery. She has extensive DCIS in the right breast and a papilloma in the left breast lower outer quadrant. She was originally seen in the [facility] by Dr. Isidore Moos, Dr. Burr Medico, and me. Her gynecologist is Elon Alas, nurse practitioner. She has now seen Dr. Iran Planas.      Original screening mammogram showed small area of calcifications in the right breast upper outer quadrant and a negative axilla. This was biopsied and showed intermediate grade DCIS. Receptor positive. Subsequent MRI showed the index cancer but also showed a 10 cm area of suspicious nonlinear non-mass enhancement within the lower central right breast. This was biopsied anterior and posterior extension and both showed DCIS.  She had a biopsy of the left breast lower outer quadrant which is papilloma with excision recommended. She had a biopsy of a mass in the left breast upper inner quadrant which shows benign fibroadenoma. No excision recommended      Past history reveals hypertension during pregnancy and deliveries. C-section 2. Tubal ligation. Elevated BMI. Family history negative for breast or prostate or colon cancer. One aunt had ovarian cancer.  social history she is married with 2 children. Denies alcohol or tobacco. 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     She is ready to go ahead and schedule surgery. My recommendation is right breast total mastectomy with sentinel lymph node biopsy. Skin reduction mastectomy is planned by Dr. Iran Planas and she will mark the incision preop.  Also planned is left breast lumpectomy with RSL for the papilloma in the left lower outer quadrant. I discussed the  indications, techniques, and risk of the surgery in detail with her. All her questions are answered. She understands all these issues. She agrees with this plan and is ready to schedule   Allergies  No Known Allergies  Allergies Reconciled   Medication History  Multivitamin Adult (Oral) Active. Medications Reconciled  Vitals Weight: 258.4 lb Height: 66in Body Surface Area: 2.23 m Body Mass Index: 41.71 kg/m  Temp.: 97.69F  Pulse: 98 (Regular)  BP: 132/86 (Sitting, Left Arm, Standard)       Physical Exam  General Mental Status-Alert. General Appearance-Not in acute distress. Build & Nutrition-Well nourished. Posture-Normal posture. Gait-Normal. Note: BMI 47   Head and Neck Head-normocephalic, atraumatic with no lesions or palpable masses. Trachea-midline. Thyroid Gland Characteristics - normal size and consistency and no palpable nodules.  Chest and Lung Exam Chest and lung exam reveals -on auscultation, normal breath sounds, no adventitious sounds and normal vocal resonance.  Breast Note: Breasts are large and ptotic. No palpable mass or skin change other than recent biopsy sites. Nipple and areola complexes looked good. No palpable axillary mass.   Cardiovascular Cardiovascular examination reveals -normal heart sounds, regular rate and rhythm with no murmurs and femoral artery auscultation bilaterally reveals normal pulses, no bruits, no thrills.  Abdomen Inspection Inspection of the abdomen reveals - No Hernias. Palpation/Percussion Palpation and Percussion of the abdomen reveal - Soft, Non Tender, No Rigidity (guarding), No hepatosplenomegaly and No Palpable abdominal masses. Note: C-section scars   Neurologic Neurologic evaluation reveals -alert and oriented x 3 with no impairment of recent or remote memory, normal attention span and ability to  concentrate, normal sensation and normal  coordination.  Musculoskeletal Normal Exam - Bilateral-Upper Extremity Strength Normal and Lower Extremity Strength Normal.    Assessment & Plan  PRIMARY CANCER OF UPPER OUTER QUADRANT OF RIGHT FEMALE BREAST (C50.411)   you have extensive ductal carcinoma in situ of the right breast and this will require a mastectomy You have a papilloma in the lower outer quadrant of the left breast and we have recommended excision of this  You have seen Dr. Iran Planas from plastic surgery. She plans immediate reconstruction with tissue expander  you will be scheduled for a right total mastectomy, right axillary sentinel node biopsy, left breast lumpectomy with radioactive seed localization I have discussed the indications, techniques, and risk of this surgery in detail  PAPILLOMA OF LEFT BREAST (D24.2) H/O: C-SECTION (I95.188)    [name] M. Dalbert [name], M.D., [facility] General and Minimally invasive Surgery Breast and Colorectal Surgery Office:   [phone] Pager:   [phone]

## 2018-06-13 ENCOUNTER — Encounter (HOSPITAL_COMMUNITY): Payer: Self-pay

## 2018-06-13 ENCOUNTER — Encounter (HOSPITAL_COMMUNITY)
Admission: RE | Admit: 2018-06-13 | Discharge: 2018-06-13 | Disposition: A | Discharge status: Home / Self Care | Payer: BC Managed Care – PPO | Source: Ambulatory Visit | Attending: General Surgery | Admitting: General Surgery

## 2018-06-13 DIAGNOSIS — Z01812 Encounter for preprocedural laboratory examination: Secondary | ICD-10-CM | POA: Insufficient documentation

## 2018-06-13 DIAGNOSIS — D242 Benign neoplasm of left breast: Secondary | ICD-10-CM | POA: Insufficient documentation

## 2018-06-13 DIAGNOSIS — C50911 Malignant neoplasm of unspecified site of right female breast: Secondary | ICD-10-CM | POA: Insufficient documentation

## 2018-06-13 HISTORY — DX: Malignant (primary) neoplasm, unspecified: C80.1

## 2018-06-13 HISTORY — DX: Other seasonal allergic rhinitis: J30.2

## 2018-06-13 HISTORY — DX: Anxiety disorder, unspecified: F41.9

## 2018-06-13 LAB — CBC
HEMATOCRIT: 42.7 % (ref 36.0–46.0)
HEMOGLOBIN: 12.6 g/dL (ref 12.0–15.0)
MCH: 25.7 pg — ABNORMAL LOW (ref 26.0–34.0)
MCHC: 29.5 g/dL — AB (ref 30.0–36.0)
MCV: 87.1 fL (ref 80.0–100.0)
Platelets: 323 10*3/uL (ref 150–400)
RBC: 4.9 MIL/uL (ref 3.87–5.11)
RDW: 12.1 % (ref 11.5–15.5)
WBC: 6.9 10*3/uL (ref 4.0–10.5)
nRBC: 0 % (ref 0.0–0.2)

## 2018-06-13 LAB — BASIC METABOLIC PANEL
Anion gap: 9 (ref 5–15)
BUN: 10 mg/dL (ref 6–20)
CHLORIDE: 104 mmol/L (ref 98–111)
CO2: 25 mmol/L (ref 22–32)
CREATININE: 0.79 mg/dL (ref 0.44–1.00)
Calcium: 9.2 mg/dL (ref 8.9–10.3)
GFR calc Af Amer: >60 mL/min (ref >60)
GFR calc non Af Amer: >60 mL/min (ref >60)
GLUCOSE: 83 mg/dL (ref 70–99)
Potassium: 3.6 mmol/L (ref 3.5–5.1)
Sodium: 138 mmol/L (ref 135–145)

## 2018-06-13 MED ORDER — CHLORHEXIDINE GLUCONATE CLOTH 2 % EX PADS: 6.0000 | MEDICATED_PAD | Freq: Once | CUTANEOUS | Status: DC

## 2018-06-13 NOTE — Pre-Procedure Instructions (Signed)
SHATISHA FALTER  06/13/2018      [id number] - [address], [place] - [address] [place] [address] Phone: [phone] Fax: [phone]    Your procedure is scheduled on 06/17/18.  Report to [facility] Admitting at 9 A.M.  Call this number if you have problems the morning of surgery:  [phone]   Remember:  Do not eat or drink after midnight.      Take these medicines the morning of surgery with A SIP OF WATER ---none    Do not wear jewelry, make-up or nail polish.  Do not wear lotions, powders, or perfumes, or deodorant.  Do not shave 48 hours prior to surgery.  Men may shave face and neck.  Do not bring valuables to the hospital.  [facility] is not responsible for any belongings or valuables.  Contacts, dentures or bridgework may not be worn into surgery.  Leave your suitcase in the car.  After surgery it may be brought to your room.  For patients admitted to the hospital, discharge time will be determined by your treatment team.  Patients discharged the day of surgery will not be allowed to drive home.   Name and phone number of your driver:  Do not take any aspirin,anti-inflammatories,vitamins,or herbal supplements 5-7 days prior to surgery. Special instructions:  [facility] - Preparing for Surgery  Before surgery, you can play an important role.  Because skin is not sterile, your skin needs to be as free of germs as possible.  You can reduce the number of germs on you skin by washing with CHG (chlorahexidine gluconate) soap before surgery.  CHG is an antiseptic cleaner which kills germs and bonds with the skin to continue killing germs even after washing.  Oral Hygiene is also important in reducing the risk of infection.  Remember to brush your teeth with your regular toothpaste the morning of surgery.  Please DO NOT use if you have an allergy to CHG or antibacterial soaps.  If your skin becomes reddened/irritated stop using  the CHG and inform your nurse when you arrive at Short Stay.  Do not shave (including legs and underarms) for at least 48 hours prior to the first CHG shower.  You may shave your face.  Please follow these instructions carefully:   1.  Shower with CHG Soap the night before surgery and the morning of Surgery.  2.  If you choose to wash your hair, wash your hair first as usual with your normal shampoo.  3.  After you shampoo, rinse your hair and body thoroughly to remove the shampoo. 4.  Use CHG as you would any other liquid soap.  You can apply chg directly to the skin and wash gently with a      scrungie or washcloth.           5.  Apply the CHG Soap to your body ONLY FROM THE NECK DOWN.   Do not use on open wounds or open sores. Avoid contact with your eyes, ears, mouth and genitals (private parts).  Wash genitals (private parts) with your normal soap.  6.  Wash thoroughly, paying special attention to the area where your surgery will be performed.  7.  Thoroughly rinse your body with warm water from the neck down.  8.  DO NOT shower/wash with your normal soap after using and rinsing off the CHG Soap.  9.  Pat yourself dry with a clean towel.  10.  Wear clean pajamas.            11.  Place clean sheets on your bed the night of your first shower and do not sleep with pets.  Day of Surgery  Do not apply any lotions/deoderants the morning of surgery.   Please wear clean clothes to the hospital/surgery center. Remember to brush your teeth with toothpaste.    Please read over the following fact sheets that you were given.

## 2018-06-16 MED ORDER — DEXTROSE 5 % IV SOLN
3.0000 g | INTRAVENOUS | Status: DC
  Filled 2018-06-16: qty 3000

## 2018-06-17 ENCOUNTER — Ambulatory Visit (HOSPITAL_COMMUNITY)
Admission: RE | Admit: 2018-06-17 | Discharge: 2018-06-17 | Disposition: A | Discharge status: Home / Self Care | Payer: BC Managed Care – PPO | Source: Ambulatory Visit | Attending: General Surgery | Admitting: General Surgery

## 2018-06-17 DIAGNOSIS — E119 Type 2 diabetes mellitus without complications: Secondary | ICD-10-CM | POA: Diagnosis not present

## 2018-06-17 DIAGNOSIS — D242 Benign neoplasm of left breast: Secondary | ICD-10-CM | POA: Diagnosis not present

## 2018-06-17 DIAGNOSIS — Z170 Estrogen receptor positive status [ER+]: Principal | ICD-10-CM

## 2018-06-17 DIAGNOSIS — D0511 Intraductal carcinoma in situ of right breast: Secondary | ICD-10-CM | POA: Diagnosis not present

## 2018-06-17 DIAGNOSIS — Z98891 History of uterine scar from previous surgery: Secondary | ICD-10-CM | POA: Diagnosis not present

## 2018-06-17 DIAGNOSIS — C50111 Malignant neoplasm of central portion of right female breast: Secondary | ICD-10-CM

## 2018-06-17 MED ORDER — DEXTROSE 5 % IV SOLN
3.0000 g | INTRAVENOUS | Status: AC
  Administered 2018-06-18: 3 g via INTRAVENOUS
  Filled 2018-06-17: qty 3

## 2018-06-17 NOTE — Progress Notes (Signed)
Pt had PAT visit on 06/13/18. Pt instructed to arrive at the ED at [place] on Sat 06/18/18 to register and be escorted to the Short Stay Unit by 0600. Pt has all previous pre-op instructions and verbalized understanding of all instructions.

## 2018-06-18 ENCOUNTER — Ambulatory Visit (HOSPITAL_COMMUNITY): Payer: BC Managed Care – PPO | Admitting: Certified Registered"

## 2018-06-18 ENCOUNTER — Encounter (HOSPITAL_COMMUNITY)
Admission: RE | Disposition: A | Discharge status: Home / Self Care | Payer: Self-pay | Source: Ambulatory Visit | Attending: General Surgery

## 2018-06-18 ENCOUNTER — Encounter (HOSPITAL_COMMUNITY): Payer: Self-pay | Admitting: *Deleted

## 2018-06-18 ENCOUNTER — Observation Stay (HOSPITAL_COMMUNITY)
Admission: RE | Admit: 2018-06-18 | Discharge: 2018-06-19 | Disposition: A | Discharge status: Home / Self Care | Payer: BC Managed Care – PPO | Source: Ambulatory Visit | Attending: General Surgery | Admitting: General Surgery

## 2018-06-18 DIAGNOSIS — D0511 Intraductal carcinoma in situ of right breast: Principal | ICD-10-CM | POA: Diagnosis present

## 2018-06-18 DIAGNOSIS — Z98891 History of uterine scar from previous surgery: Secondary | ICD-10-CM | POA: Insufficient documentation

## 2018-06-18 DIAGNOSIS — E119 Type 2 diabetes mellitus without complications: Secondary | ICD-10-CM | POA: Insufficient documentation

## 2018-06-18 DIAGNOSIS — D242 Benign neoplasm of left breast: Secondary | ICD-10-CM | POA: Insufficient documentation

## 2018-06-18 HISTORY — PX: BREAST LUMPECTOMY WITH RADIOACTIVE SEED LOCALIZATION: SHX6424

## 2018-06-18 HISTORY — PX: MASTECTOMY W/ SENTINEL NODE BIOPSY: SHX2001

## 2018-06-18 HISTORY — PX: BREAST RECONSTRUCTION WITH PLACEMENT OF TISSUE EXPANDER AND ALLODERM: SHX6805

## 2018-06-18 LAB — GLUCOSE, CAPILLARY: GLUCOSE-CAPILLARY: 87 mg/dL (ref 70–99)

## 2018-06-18 LAB — POCT PREGNANCY, URINE: PREG TEST UR: NEGATIVE

## 2018-06-18 SURGERY — BREAST LUMPECTOMY WITH RADIOACTIVE SEED LOCALIZATION
Anesthesia: General | Site: Breast | Laterality: Right

## 2018-06-18 MED ORDER — OXYCODONE HCL 5 MG PO TABS: 5.0000 mg | ORAL_TABLET | ORAL | 0 refills | Status: DC | PRN

## 2018-06-18 MED ORDER — MORPHINE SULFATE (PF) 2 MG/ML IV SOLN: 2.0000 mg | INTRAVENOUS | Status: DC | PRN

## 2018-06-18 MED ORDER — LIDOCAINE 2% (20 MG/ML) 5 ML SYRINGE
INTRAMUSCULAR | Status: DC | PRN
  Administered 2018-06-18: 10 mg via INTRAVENOUS

## 2018-06-18 MED ORDER — PROPOFOL 10 MG/ML IV BOLUS
INTRAVENOUS | Status: DC | PRN
  Administered 2018-06-18: 200 mg via INTRAVENOUS

## 2018-06-18 MED ORDER — MEPERIDINE HCL 50 MG/ML IJ SOLN: 6.2500 mg | INTRAMUSCULAR | Status: DC | PRN

## 2018-06-18 MED ORDER — ONDANSETRON HCL 4 MG/2ML IJ SOLN: 4.0000 mg | Freq: Four times a day (QID) | INTRAMUSCULAR | Status: DC | PRN

## 2018-06-18 MED ORDER — CELECOXIB 200 MG PO CAPS
200.0000 mg | ORAL_CAPSULE | ORAL | Status: AC
  Administered 2018-06-18: 200 mg via ORAL
  Filled 2018-06-18: qty 1

## 2018-06-18 MED ORDER — BUPIVACAINE HCL (PF) 0.5 % IJ SOLN
INTRAMUSCULAR | Status: DC | PRN
  Administered 2018-06-18: 30 mL

## 2018-06-18 MED ORDER — SODIUM CHLORIDE 0.9 % IJ SOLN
INTRAMUSCULAR | Status: AC
  Filled 2018-06-18: qty 10

## 2018-06-18 MED ORDER — SODIUM CHLORIDE 0.9 % IV SOLN
Freq: Once | INTRAVENOUS | Status: DC
  Filled 2018-06-18 (×2): qty 1

## 2018-06-18 MED ORDER — METHOCARBAMOL 500 MG PO TABS: 500.0000 mg | ORAL_TABLET | Freq: Three times a day (TID) | ORAL | 0 refills | Status: DC | PRN

## 2018-06-18 MED ORDER — DEXAMETHASONE SODIUM PHOSPHATE 10 MG/ML IJ SOLN
INTRAMUSCULAR | Status: AC
  Filled 2018-06-18: qty 1

## 2018-06-18 MED ORDER — OXYCODONE HCL 5 MG/5ML PO SOLN: 5.0000 mg | Freq: Once | ORAL | Status: DC | PRN

## 2018-06-18 MED ORDER — SIMETHICONE 80 MG PO CHEW: 40.0000 mg | CHEWABLE_TABLET | Freq: Four times a day (QID) | ORAL | Status: DC | PRN

## 2018-06-18 MED ORDER — SODIUM CHLORIDE 0.9 % IV SOLN
INTRAVENOUS | Status: DC | PRN
  Administered 2018-06-18: 1000 mL

## 2018-06-18 MED ORDER — SODIUM CHLORIDE 0.9 % IJ SOLN
INTRAVENOUS | Status: DC | PRN
  Administered 2018-06-18: 08:00:00 via INTRAMUSCULAR

## 2018-06-18 MED ORDER — SUGAMMADEX SODIUM 200 MG/2ML IV SOLN
INTRAVENOUS | Status: DC | PRN
  Administered 2018-06-18: 200 mg via INTRAVENOUS

## 2018-06-18 MED ORDER — TECHNETIUM TC 99M SULFUR COLLOID FILTERED
1.0000 | Freq: Once | INTRAVENOUS | Status: AC | PRN
  Administered 2018-06-18: 1 via INTRADERMAL

## 2018-06-18 MED ORDER — CLONIDINE HCL (ANALGESIA) 100 MCG/ML EP SOLN
EPIDURAL | Status: DC | PRN
  Administered 2018-06-18: 50 ug

## 2018-06-18 MED ORDER — SULFAMETHOXAZOLE-TRIMETHOPRIM 800-160 MG PO TABS: 1.0000 | ORAL_TABLET | Freq: Two times a day (BID) | ORAL | 0 refills | Status: DC

## 2018-06-18 MED ORDER — MIDAZOLAM HCL 5 MG/5ML IJ SOLN
INTRAMUSCULAR | Status: DC | PRN
  Administered 2018-06-18: 2 mg via INTRAVENOUS

## 2018-06-18 MED ORDER — FENTANYL CITRATE (PF) 250 MCG/5ML IJ SOLN
INTRAMUSCULAR | Status: AC
  Filled 2018-06-18: qty 5

## 2018-06-18 MED ORDER — ACETAMINOPHEN 500 MG PO TABS
1000.0000 mg | ORAL_TABLET | ORAL | Status: AC
  Administered 2018-06-18: 1000 mg via ORAL
  Filled 2018-06-18: qty 2

## 2018-06-18 MED ORDER — FENTANYL CITRATE (PF) 100 MCG/2ML IJ SOLN: 25.0000 ug | INTRAMUSCULAR | Status: DC | PRN

## 2018-06-18 MED ORDER — KETOROLAC TROMETHAMINE 30 MG/ML IJ SOLN
30.0000 mg | Freq: Three times a day (TID) | INTRAMUSCULAR | Status: AC
  Administered 2018-06-18 – 2018-06-19 (×3): 30 mg via INTRAVENOUS
  Filled 2018-06-18 (×3): qty 1

## 2018-06-18 MED ORDER — ONDANSETRON HCL 4 MG/2ML IJ SOLN
INTRAMUSCULAR | Status: DC | PRN
  Administered 2018-06-18: 4 mg via INTRAVENOUS

## 2018-06-18 MED ORDER — DEXAMETHASONE SODIUM PHOSPHATE 10 MG/ML IJ SOLN
INTRAMUSCULAR | Status: DC | PRN
  Administered 2018-06-18: 10 mg via INTRAVENOUS

## 2018-06-18 MED ORDER — FENTANYL CITRATE (PF) 100 MCG/2ML IJ SOLN
INTRAMUSCULAR | Status: DC | PRN
  Administered 2018-06-18: 100 ug via INTRAVENOUS
  Administered 2018-06-18 (×2): 50 ug via INTRAVENOUS

## 2018-06-18 MED ORDER — METHYLENE BLUE 0.5 % INJ SOLN
INTRAVENOUS | Status: AC
  Filled 2018-06-18: qty 10

## 2018-06-18 MED ORDER — MIDAZOLAM HCL 2 MG/2ML IJ SOLN
INTRAMUSCULAR | Status: AC
  Filled 2018-06-18: qty 2

## 2018-06-18 MED ORDER — DIPHENHYDRAMINE HCL 12.5 MG/5ML PO ELIX: 12.5000 mg | ORAL_SOLUTION | Freq: Four times a day (QID) | ORAL | Status: DC | PRN

## 2018-06-18 MED ORDER — METHOCARBAMOL 500 MG PO TABS
500.0000 mg | ORAL_TABLET | Freq: Three times a day (TID) | ORAL | Status: DC
  Administered 2018-06-18 – 2018-06-19 (×3): 500 mg via ORAL
  Filled 2018-06-18 (×3): qty 1

## 2018-06-18 MED ORDER — PROMETHAZINE HCL 25 MG/ML IJ SOLN
INTRAMUSCULAR | Status: AC
  Administered 2018-06-18: 12.5 mg via INTRAVENOUS
  Filled 2018-06-18: qty 1

## 2018-06-18 MED ORDER — PROPOFOL 10 MG/ML IV BOLUS
INTRAVENOUS | Status: AC
  Filled 2018-06-18: qty 20

## 2018-06-18 MED ORDER — LACTATED RINGERS IV SOLN
INTRAVENOUS | Status: DC
  Administered 2018-06-18: 07:00:00 via INTRAVENOUS

## 2018-06-18 MED ORDER — DIPHENHYDRAMINE HCL 50 MG/ML IJ SOLN: 12.5000 mg | Freq: Four times a day (QID) | INTRAMUSCULAR | Status: DC | PRN

## 2018-06-18 MED ORDER — ALBUMIN HUMAN 5 % IV SOLN
INTRAVENOUS | Status: DC | PRN
  Administered 2018-06-18: 10:00:00 via INTRAVENOUS

## 2018-06-18 MED ORDER — ROCURONIUM BROMIDE 50 MG/5ML IV SOSY
PREFILLED_SYRINGE | INTRAVENOUS | Status: DC | PRN
  Administered 2018-06-18: 50 mg via INTRAVENOUS

## 2018-06-18 MED ORDER — OXYCODONE HCL 5 MG PO TABS: 5.0000 mg | ORAL_TABLET | Freq: Once | ORAL | Status: DC | PRN

## 2018-06-18 MED ORDER — ACETAMINOPHEN 500 MG PO TABS
1000.0000 mg | ORAL_TABLET | Freq: Four times a day (QID) | ORAL | Status: DC
  Administered 2018-06-18 – 2018-06-19 (×3): 1000 mg via ORAL
  Filled 2018-06-18 (×3): qty 2

## 2018-06-18 MED ORDER — OXYCODONE HCL 5 MG PO TABS: 5.0000 mg | ORAL_TABLET | ORAL | Status: DC | PRN

## 2018-06-18 MED ORDER — BUPIVACAINE-EPINEPHRINE (PF) 0.25% -1:200000 IJ SOLN
INTRAMUSCULAR | Status: AC
  Filled 2018-06-18: qty 30

## 2018-06-18 MED ORDER — PROMETHAZINE HCL 25 MG/ML IJ SOLN
6.2500 mg | INTRAMUSCULAR | Status: DC | PRN
  Administered 2018-06-18: 12.5 mg via INTRAVENOUS

## 2018-06-18 MED ORDER — 0.9 % SODIUM CHLORIDE (POUR BTL) OPTIME
TOPICAL | Status: DC | PRN
  Administered 2018-06-18: 2000 mL

## 2018-06-18 MED ORDER — ONDANSETRON 4 MG PO TBDP: 4.0000 mg | ORAL_TABLET | Freq: Four times a day (QID) | ORAL | Status: DC | PRN

## 2018-06-18 MED ORDER — GABAPENTIN 300 MG PO CAPS
300.0000 mg | ORAL_CAPSULE | ORAL | Status: AC
  Administered 2018-06-18: 300 mg via ORAL
  Filled 2018-06-18: qty 1

## 2018-06-18 MED ORDER — SODIUM CHLORIDE 0.9 % IV SOLN
INTRAVENOUS | Status: DC
  Administered 2018-06-18: 14:00:00 via INTRAVENOUS

## 2018-06-18 MED ORDER — SODIUM CHLORIDE 0.9 % IV SOLN
INTRAVENOUS | Status: DC | PRN
  Administered 2018-06-18: 15 ug/min via INTRAVENOUS

## 2018-06-18 MED ORDER — CEFAZOLIN SODIUM-DEXTROSE 2-4 GM/100ML-% IV SOLN
2.0000 g | Freq: Three times a day (TID) | INTRAVENOUS | Status: DC
  Administered 2018-06-18 – 2018-06-19 (×3): 2 g via INTRAVENOUS
  Filled 2018-06-18 (×4): qty 100

## 2018-06-18 MED ORDER — BISACODYL 5 MG PO TBEC: 5.0000 mg | DELAYED_RELEASE_TABLET | Freq: Every day | ORAL | Status: DC | PRN

## 2018-06-18 MED ORDER — ONDANSETRON HCL 4 MG/2ML IJ SOLN
INTRAMUSCULAR | Status: AC
  Filled 2018-06-18: qty 2

## 2018-06-18 SURGICAL SUPPLY — 76 items
ADH SKN CLS APL DERMABOND .7 (GAUZE/BANDAGES/DRESSINGS) ×6
ALLOGRAFT PERF 16X20 1.6+/-0.4 (Tissue) ×1 IMPLANT
BAG DECANTER FOR FLEXI CONT (MISCELLANEOUS) ×4 IMPLANT
BINDER BREAST XXLRG (GAUZE/BANDAGES/DRESSINGS) ×1 IMPLANT
BLADE SURG 15 STRL LF DISP TIS (BLADE) ×3 IMPLANT
BLADE SURG 15 STRL SS (BLADE) ×4
CANISTER SUCT 3000ML PPV (MISCELLANEOUS) ×4 IMPLANT
CHLORAPREP W/TINT 26ML (MISCELLANEOUS) ×5 IMPLANT
CONT SPEC 4OZ CLIKSEAL STRL BL (MISCELLANEOUS) ×4 IMPLANT
COVER PROBE W GEL 5X96 (DRAPES) ×4 IMPLANT
COVER SURGICAL LIGHT HANDLE (MISCELLANEOUS) ×4 IMPLANT
COVER WAND RF STERILE (DRAPES) ×4 IMPLANT
DERMABOND ADVANCED (GAUZE/BANDAGES/DRESSINGS) ×2
DERMABOND ADVANCED .7 DNX12 (GAUZE/BANDAGES/DRESSINGS) IMPLANT
DEVICE DUBIN SPECIMEN MAMMOGRA (MISCELLANEOUS) ×4 IMPLANT
DRAIN CHANNEL 15F RND FF W/TCR (WOUND CARE) ×1 IMPLANT
DRAIN CHANNEL 19F RND (DRAIN) ×4 IMPLANT
DRAPE CHEST BREAST 15X10 FENES (DRAPES) ×4 IMPLANT
DRAPE ORTHO SPLIT 77X108 STRL (DRAPES) ×8
DRAPE SURG ORHT 6 SPLT 77X108 (DRAPES) ×6 IMPLANT
DRAPE UTILITY XL STRL (DRAPES) ×4 IMPLANT
DRSG TEGADERM 4X4.75 (GAUZE/BANDAGES/DRESSINGS) ×4 IMPLANT
ELECT CAUTERY BLADE 6.4 (BLADE) ×4 IMPLANT
ELECT COATED BLADE 2.86 ST (ELECTRODE) ×4 IMPLANT
ELECT REM PT RETURN 9FT ADLT (ELECTROSURGICAL) ×8
ELECTRODE REM PT RTRN 9FT ADLT (ELECTROSURGICAL) ×6 IMPLANT
EVACUATOR SILICONE 100CC (DRAIN) ×5 IMPLANT
EXPANDER TISSUE FV FOURTE 500 (Prosthesis & Implant Plastic) IMPLANT
GLOVE BIO SURGEON STRL SZ 6 (GLOVE) ×12 IMPLANT
GLOVE BIO SURGEON STRL SZ7 (GLOVE) ×8 IMPLANT
GLOVE BIOGEL PI IND STRL 7.5 (GLOVE) ×3 IMPLANT
GLOVE BIOGEL PI INDICATOR 7.5 (GLOVE) ×1
GOWN STRL REUS W/ TWL LRG LVL3 (GOWN DISPOSABLE) ×9 IMPLANT
GOWN STRL REUS W/TWL LRG LVL3 (GOWN DISPOSABLE) ×12
KIT BASIN OR (CUSTOM PROCEDURE TRAY) ×8 IMPLANT
KIT MARKER MARGIN INK (KITS) ×4 IMPLANT
KIT TURNOVER KIT B (KITS) ×4 IMPLANT
LIGHT WAVEGUIDE WIDE FLAT (MISCELLANEOUS) ×1 IMPLANT
MARKER SKIN DUAL TIP RULER LAB (MISCELLANEOUS) ×4 IMPLANT
NDL FILTER BLUNT 18X1 1/2 (NEEDLE) IMPLANT
NDL HYPO 25GX1X1/2 BEV (NEEDLE) IMPLANT
NEEDLE FILTER BLUNT 18X 1/2SAF (NEEDLE) ×1
NEEDLE FILTER BLUNT 18X1 1/2 (NEEDLE) ×3 IMPLANT
NEEDLE HYPO 25GX1X1/2 BEV (NEEDLE) ×4 IMPLANT
NS IRRIG 1000ML POUR BTL (IV SOLUTION) ×13 IMPLANT
PACK GENERAL/GYN (CUSTOM PROCEDURE TRAY) ×4 IMPLANT
PACK SURGICAL SETUP 50X90 (CUSTOM PROCEDURE TRAY) ×4 IMPLANT
PAD ABD 8X10 STRL (GAUZE/BANDAGES/DRESSINGS) ×3 IMPLANT
PAD ARMBOARD 7.5X6 YLW CONV (MISCELLANEOUS) ×4 IMPLANT
PENCIL BUTTON HOLSTER BLD 10FT (ELECTRODE) ×4 IMPLANT
PIN SAFETY STERILE (MISCELLANEOUS) ×1 IMPLANT
SPECIMEN JAR X LARGE (MISCELLANEOUS) ×4 IMPLANT
SPONGE LAP 18X18 X RAY DECT (DISPOSABLE) ×4 IMPLANT
STAPLER VISISTAT 35W (STAPLE) ×1 IMPLANT
STRIP CLOSURE SKIN 1/2X4 (GAUZE/BANDAGES/DRESSINGS) ×1 IMPLANT
SUT CHROMIC 4 0 PS 2 18 (SUTURE) ×5 IMPLANT
SUT ETHILON 2 0 FS 18 (SUTURE) ×1 IMPLANT
SUT MNCRL AB 4-0 PS2 18 (SUTURE) ×2 IMPLANT
SUT MON AB 5-0 PS2 18 (SUTURE) ×2 IMPLANT
SUT SILK 2 0 PERMA HAND 18 BK (SUTURE) ×1 IMPLANT
SUT VIC AB 0 CT2 27 (SUTURE) ×2 IMPLANT
SUT VIC AB 2-0 SH 27 (SUTURE) ×4
SUT VIC AB 2-0 SH 27XBRD (SUTURE) IMPLANT
SUT VIC AB 3-0 SH 18 (SUTURE) ×1 IMPLANT
SUT VIC AB 3-0 SH 27 (SUTURE) ×8
SUT VIC AB 3-0 SH 27X BRD (SUTURE) IMPLANT
SUT VIC AB 4-0 PS2 18 (SUTURE) ×1 IMPLANT
SUT VLOC 180 0 24IN GS25 (SUTURE) ×1 IMPLANT
SYR BULB 3OZ (MISCELLANEOUS) ×4 IMPLANT
SYR CONTROL 10ML LL (SYRINGE) ×4 IMPLANT
TISSUE EXPNDR FV FOURTE 500 (Prosthesis & Implant Plastic) ×4 IMPLANT
TOWEL OR 17X24 6PK STRL BLUE (TOWEL DISPOSABLE) ×4 IMPLANT
TOWEL OR 17X26 10 PK STRL BLUE (TOWEL DISPOSABLE) ×4 IMPLANT
TOWEL OR NON WOVEN STRL DISP B (DISPOSABLE) ×1 IMPLANT
TUBE CONNECTING 12X1/4 (SUCTIONS) ×4 IMPLANT
YANKAUER SUCT BULB TIP NO VENT (SUCTIONS) ×4 IMPLANT

## 2018-06-18 NOTE — Op Note (Signed)
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  Right skin reduction mastectomy 2.  Injection of methylene blue dye for sentinel node identification 3.  Right deep axillary sentinel lymph node biopsy 4.  Left breast seed guided excisional biopsy Surgeon: Dr. Serita Grammes Assistant: Dr. Irene Limbo Estimated blood loss: Less than 30 cc Anesthesia: General with right pectoral block Complications: None Drains: None Specimens: 1.  Left breast tissue containing seed and clip marked with paint 2.  Right mastectomy short superior long lateral 3.  Right axillary sentinel lymph nodes with highest count of 3132 Drains: Per plastic surgery Special count was correct at completion Disposition Case turned over to plastic surgery for reconstruction  Indications: This a 42 year old female who presents with a large area of right breast ductal carcinoma in situ and a left-sided papilloma.  She is elected undergo right skin reduction mastectomy with immediate expander reconstruction in conjunction with a sentinel lymph node biopsy in the right side.  We discussed a radioactive seed guided excision of the papilloma on core biopsy on the left side.  Procedure: After informed consent was obtained the patient was first given antibiotics.  SCDs were placed.  She was injected injected with technetium in the standard periareolar fashion.  She underwent a right pectoral block.  She was placed under general anesthesia without complication.  Her breasts were then prepped and draped in standard sterile surgical fashion.  Surgical timeout was then performed.  I first did a left-sided excision.  I had the mammograms in the operating room.  I made a periareolar incision and then dissected down to the seed.  I remove the seed and the surrounding tissue.  I confirmed removal of the radioactive seed as well as the clip by mammography.  Pins  were placed.  Hemostasis was obtained.  I closed this with 2-0 Vicryl, 3-0 Vicryl, and 5-0 Monocryl.  Glue was placed.  I then performed the right mastectomy.  I made a triangular incision around the nipple areola to perform the skin reduction mastectomy.  I then created flaps to the parasternal region, clavicle, inframammary fold, and latissimus laterally.  The breast was then removed with the fascia from the pectoralis muscle and passed off the table.  This was marked short superior and long lateral.  I then used the neoprobe to identify 2 sentinel lymph nodes with the highest count as listed above.  These were removed and there was no background radioactivity present.  Hemostasis was observed.  I then turned the case over to plastic surgery for reconstruction.

## 2018-06-18 NOTE — Anesthesia Procedure Notes (Signed)
Procedure Name: Intubation Date/Time: 06/18/2018 7:50 AM Performed by: Kyung Rudd, CRNA Pre-anesthesia Checklist: Patient identified, Emergency Drugs available, Suction available, Patient being monitored and Timeout performed Patient Re-evaluated:Patient Re-evaluated prior to induction Oxygen Delivery Method: Circle system utilized Preoxygenation: Pre-oxygenation with 100% oxygen Induction Type: IV induction Ventilation: Mask ventilation without difficulty Laryngoscope Size: Mac and 3 Grade View: Grade I Tube type: Oral Tube size: 7.0 mm Number of attempts: 1 Airway Equipment and Method: Stylet Placement Confirmation: ETT inserted through vocal cords under direct vision,  positive ETCO2 and breath sounds checked- equal and bilateral Secured at: 21 cm Tube secured with: Tape Dental Injury: Teeth and Oropharynx as per pre-operative assessment

## 2018-06-18 NOTE — Interval H&P Note (Signed)
History and Physical Interval Note:  06/18/2018 6:38 AM I have seen patient and reviewed all data as well as discussed with Dr Dalbert Batman.  She has left papilloma that will excise with seed guidance.  Has extensive dcis on right side and will plan srm with ax sn biopsy combined with expander reconstruction.   [name]  has presented today for surgery, with the diagnosis of LEFT BREAST PAPILLOMA,RIGHT BREAST CANCER  The various methods of treatment have been discussed with the patient and family. After consideration of risks, benefits and other options for treatment, the patient has consented to  Procedure(s): BREAST LUMPECTOMY WITH RADIOACTIVE SEED LOCALIZATION (Left) RIGHT MASTECTOMY WITH SENTINEL LYMPH NODE BIOPSY (Right) RIGHT BREAST RECONSTRUCTION WITH PLACEMENT OF TISSUE EXPANDER AND ALLODERM (Right) as a surgical intervention .  The patient's history has been reviewed, patient examined, no change in status, stable for surgery.  I have reviewed the patient's chart and labs.  Questions were answered to the patient's satisfaction.     Rolm Bookbinder

## 2018-06-18 NOTE — Anesthesia Preprocedure Evaluation (Signed)
Anesthesia Evaluation  Patient identified by MRN, date of birth, ID band Patient awake    Reviewed: Allergy & Precautions, NPO status , Patient's Chart, lab work & pertinent test results  Airway Mallampati: II  TM Distance: >3 FB Neck ROM: Full    Dental  (+) Dental Advisory Given   Pulmonary asthma ,    Pulmonary exam normal breath sounds clear to auscultation       Cardiovascular negative cardio ROS Normal cardiovascular exam Rhythm:Regular Rate:Normal     Neuro/Psych PSYCHIATRIC DISORDERS Anxiety negative neurological ROS     GI/Hepatic negative GI ROS, Neg liver ROS,   Endo/Other  diabetesMorbid obesity  Renal/GU negative Renal ROS     Musculoskeletal negative musculoskeletal ROS (+)   Abdominal (+) + obese,   Peds  Hematology negative hematology ROS (+)   Anesthesia Other Findings   Reproductive/Obstetrics negative OB ROS                             Anesthesia Physical Anesthesia Plan  ASA: III  Anesthesia Plan: General   Post-op Pain Management:    Induction: Intravenous  PONV Risk Score and Plan: 4 or greater and Ondansetron, Dexamethasone, Scopolamine patch - Pre-op, Midazolam and Treatment may vary due to age or medical condition  Airway Management Planned: Oral ETT  Additional Equipment:   Intra-op Plan:   Post-operative Plan: Extubation in OR  Informed Consent: I have reviewed the patients History and Physical, chart, labs and discussed the procedure including the risks, benefits and alternatives for the proposed anesthesia with the patient or authorized representative who has indicated his/her understanding and acceptance.   Dental advisory given  Plan Discussed with: CRNA  Anesthesia Plan Comments:         Anesthesia Quick Evaluation

## 2018-06-18 NOTE — Progress Notes (Signed)
Nuc. Med at bedside performing injections.

## 2018-06-18 NOTE — Interval H&P Note (Signed)
History and Physical Interval Note:  06/18/2018 7:04 AM  [name]  has presented today for surgery, with the diagnosis of LEFT BREAST PAPILLOMA,RIGHT BREAST CANCER  The various methods of treatment have been discussed with the patient and family. After consideration of risks, benefits and other options for treatment, the patient has consented to  Procedure(s): BREAST LUMPECTOMY WITH RADIOACTIVE SEED LOCALIZATION (Left) RIGHT MASTECTOMY WITH SENTINEL LYMPH NODE BIOPSY (Right) RIGHT BREAST RECONSTRUCTION WITH PLACEMENT OF TISSUE EXPANDER AND ALLODERM (Right) as a surgical intervention .  The patient's history has been reviewed, patient examined, no change in status, stable for surgery.  I have reviewed the patient's chart and labs.  Questions were answered to the patient's satisfaction.     Arnoldo Hooker Jadwiga Faidley

## 2018-06-18 NOTE — Transfer of Care (Signed)
Immediate Anesthesia Transfer of Care Note  Patient: [name]  Procedure(s) Performed: BREAST LUMPECTOMY WITH RADIOACTIVE SEED LOCALIZATION (Left Breast) RIGHT MASTECTOMY WITH SENTINEL LYMPH NODE BIOPSY (Right Breast) RIGHT BREAST RECONSTRUCTION WITH PLACEMENT OF TISSUE EXPANDER AND ALLODERM (Right )  Patient Location: PACU  Anesthesia Type:GA combined with regional for post-op pain  Level of Consciousness: drowsy  Airway & Oxygen Therapy: Patient Spontanous Breathing and Patient connected to nasal cannula oxygen  Post-op Assessment: Report given to RN, Post -op Vital signs reviewed and stable and Patient moving all extremities X 4  Post vital signs: Reviewed and stable  Last Vitals:  Vitals Value Taken Time  BP 122/67 06/18/2018 11:12 AM  Temp    Pulse 78 06/18/2018 11:14 AM  Resp 24 06/18/2018 11:14 AM  SpO2 100 % 06/18/2018 11:14 AM  Vitals shown include unvalidated device data.  Last Pain:  Vitals:   06/18/18 0628  TempSrc: Oral  PainSc: 0-No pain      Patients Stated Pain Goal: 3 (84/72/07 2182)  Complications: No apparent anesthesia complications

## 2018-06-18 NOTE — Brief Op Note (Signed)
06/18/2018  9:25 AM  PATIENT:  [name]  42 y.o. female  PRE-OPERATIVE DIAGNOSIS:  LEFT BREAST PAPILLOMA,RIGHT BREAST CANCER  POST-OPERATIVE DIAGNOSIS:  LEFT BREAST PAPILLOMA,RIGHT BREAST CANCER  PROCEDURE:  Procedure(s): BREAST LUMPECTOMY WITH RADIOACTIVE SEED LOCALIZATION (Left) RIGHT MASTECTOMY WITH SENTINEL LYMPH NODE BIOPSY (Right) RIGHT BREAST RECONSTRUCTION WITH PLACEMENT OF TISSUE EXPANDER AND ALLODERM (Right)  SURGEON:  Surgeon(s) and Role: Panel 1:    Rolm Bookbinder, MD - Primary Panel 2:    * Irene Limbo, MD - Primary  PHYSICIAN ASSISTANT:   ASSISTANTS: none   ANESTHESIA:   general with right pec block  EBL:  <30 cc  BLOOD ADMINISTERED:none  DRAINS: per plastic surgery    SPECIMEN:  Excision and Mastectomy with SLN  DISPOSITION OF SPECIMEN:  PATHOLOGY  COUNTS:  YES  TOURNIQUET:  * No tourniquets in log *  DICTATION: .Dragon Dictation  PLAN OF CARE: Admit for overnight observation  PATIENT DISPOSITION:  Case turned over to plastic surgery   Delay start of Pharmacological VTE agent (>24hrs) due to surgical blood loss or risk of bleeding: not applicable

## 2018-06-18 NOTE — Anesthesia Procedure Notes (Addendum)
Anesthesia Regional Block: Pectoralis block   Pre-Anesthetic Checklist: ,, timeout performed, Correct Patient, Correct Site, Correct Laterality, Correct Procedure, Correct Position, site marked, Risks and benefits discussed,  Surgical consent,  Pre-op evaluation,  At surgeon's request and post-op pain management  Laterality: Right  Prep: chloraprep       Needles:   Needle Type: Stimiplex     Needle Length: 9cm      Additional Needles:   Procedures:,,,, ultrasound used (permanent image in chart),,,,  Narrative:  Start time: 06/18/2018 7:20 AM End time: 06/18/2018 7:26 AM Injection made incrementally with aspirations every 5 mL.  Performed by: Personally  Anesthesiologist: Nolon Nations, MD  Additional Notes: Patient tolerated well. Good fascial spread noted.

## 2018-06-18 NOTE — Op Note (Signed)
Operative Note   DATE OF OPERATION: 11.2.19  LOCATION: [facility]-observ[facility]  SURGICAL DIVISION: Plastic Surgery  PREOPERATIVE DIAGNOSES:  1. DCIS right breast 2. Papilloma left breast  POSTOPERATIVE DIAGNOSES:  same  PROCEDURE:  1. Right breast reconstruction with tissue expander 2. Acellular dermis (Alloderm) for breast reconstruciton  SURGEON: Irene Limbo MD MBA  ASSISTANT: none  ANESTHESIA:  General.   EBL: 50 ml for entire procedure  COMPLICATIONS: None immediate.   INDICATIONS FOR PROCEDURE:  The patient, [name], is a 42 y.o. female born on March 05, 1976, is here for immediate prepectoral expander acellular dermis reconstruction following skin reduction pattern mastectomy.   FINDINGS: Natrelle 133S FV-13-T 500 ml tissue expander placed. Initial fill volume 420 ml air. SN [id number]  DESCRIPTION OF PROCEDURE:  The patient was marked with the patient in the preoperative area to mark sternal notch, chest midline, anterior axillary lines and inframammary folds. Patient was marked for skin reduction mastectomy with most superior portion nipple areola marked on breast meridian. Vertical limbs marked by breast displacement and set at 9cm length. The patient was taken to the operating room. SCDs were placed and IV antibiotics were given. The patient's operative site was prepped and draped in a sterile fashion. A time out was performed and all information was confirmed to be correct. In supine position, the lateral limbs for resection marked and area over lower pole preserved as inferiorly based dermal pedicle. Skin de epithelialized in this area.I assisted in mastectomy and sentinel LN sampling with exposure and retraction.  Following completion of mastectomy, the cavity was irrigated with solution containing Ancef, gentamicin, and bacitracin. Hemostasis was ensured. A 19 Fr drain was placed in subcutaneous position laterally anda 15 Fr drain placed along inframammary fold.  Eachsecured to skin with 2-0 nylon. Cavity irrigated with Betadine.The tissue expanderwasprepared on back table prior in insertion. The expander was filled with air to 420 ml. Perforated acellular dermis was draped over anterior surface expander. The ADM was then secured to itself over posterior surface of expander with 4-0 chromic. Redundant folds acellular dermis excised so that the ADM lie flat without folds over air filled expander.The expander was secured to medial insertion pectoralis with a 0 vicryl.The lateral tab also secured to pectoralis muscle with 0-vicryl. The ADM was secured to pectoralis muscle and chest wall along inferior border at inframammary foldwith 0 V lock suture.Laterally the mastectomy flap over posterior axillary line was advanced anteriorly and the subcutaneous tissue and superficial fascia was secured to pectoralis muscle and acellular dermis with 0-vicryl. The inferiorly based dermal pedicle was redraped superiorly over expander and acellular dermis and secured to pectoralis with interrupted 0-vicryl. Skin closure completedwith 3-0 vicryl in fascial layer and 4-0 vicryl in dermis. Skin closure completed with 4-0 monocryl subcuticular and tissue adhesive.  Tegaderms applied over skin flaps. Dry dressing and breast binder applied.The patient was allowed to wakefromanesthesia, extubatedand taken to the recovery room in satisfactory condition.   SPECIMENS: none  DRAINS: 15 and 19 Fr JP in right breast reconstruction  Irene Limbo, MD [facility] [phone], pin [phone]

## 2018-06-19 DIAGNOSIS — D0511 Intraductal carcinoma in situ of right breast: Secondary | ICD-10-CM | POA: Diagnosis not present

## 2018-06-19 NOTE — Progress Notes (Signed)
Patient discharged to home with prescriptions and instructions. 

## 2018-06-19 NOTE — Discharge Instructions (Signed)
[facility] °[phone] ° °MASTECTOMY: POST OP INSTRUCTIONS °Take 400 mg of ibuprofen every 8 hours or 650 mg tylenol every 6 hours for next 72 hours then as needed. Use ice several times daily also. °Always review your discharge instruction sheet given to you by the facility where your surgery was performed. ° °IF YOU HAVE DISABILITY OR FAMILY LEAVE FORMS, YOU MUST BRING THEM TO THE OFFICE FOR PROCESSING.   °DO NOT GIVE THEM TO YOUR DOCTOR. °A prescription for pain medication may be given to you upon discharge.  Take your pain medication as prescribed, if needed.  If narcotic pain medicine is not needed, then you may take acetaminophen (Tylenol), naprosyn (Alleve) or ibuprofen (Advil) as needed. °1. Take your usually prescribed medications unless otherwise directed. °2. If you need a refill on your pain medication, please contact your pharmacy.  They will contact our office to request authorization.  Prescriptions will not be filled after 5pm or on week-ends. °3. You should follow a light diet the first few days after arrival home, such as soup and crackers, etc.  Resume your normal diet the day after surgery. °4. Most patients will experience some swelling and bruising on the chest and underarm.  Ice packs will help.  Swelling and bruising can take several days to resolve. Wear the binder day and night until you return to the office.  °5. It is common to experience some constipation if taking pain medication after surgery.  Increasing fluid intake and taking a stool softener (such as Colace) will usually help or prevent this problem from occurring.  A mild laxative (Milk of Magnesia or Miralax) should be taken according to package instructions if there are no bowel movements after 48 hours. °6. Unless discharge instructions indicate otherwise, leave your bandage dry and in place until your next appointment in 3-5 days.  You may take a limited sponge bath.  No tube baths or showers until the  drains are removed.  You may have steri-strips (small skin tapes) in place directly over the incision.  These strips should be left on the skin for 7-10 days. If you have glue it will come off in next couple week.  Any sutures will be removed at an office visit °7. DRAINS:  If you have drains in place, it is important to keep a list of the amount of drainage produced each day in your drains.  Before leaving the hospital, you should be instructed on drain care.  Call our office if you have any questions about your drains. I will remove your drains when they put out less than 30 cc or ml for 2 consecutive days. °8. ACTIVITIES:  You may resume regular (light) daily activities beginning the next day--such as daily self-care, walking, climbing stairs--gradually increasing activities as tolerated.  You may have sexual intercourse when it is comfortable.  Refrain from any heavy lifting or straining until approved by your doctor. °a. You may drive when you are no longer taking prescription pain medication, you can comfortably wear a seatbelt, and you can safely maneuver your car and apply brakes. °b. RETURN TO WORK:  __________________________________________________________ °9. You should see your doctor in the office for a follow-up appointment approximately 3-5 days after your surgery.  Your doctor’s nurse will typically make your follow-up appointment when she calls you with your pathology report.  Expect your pathology report 3-4business days after surgery. °10. OTHER INSTRUCTIONS: ______________________________________________________________________________________________ ____________________________________________________________________________________________ °WHEN TO CALL YOUR DR Trig Mcbryar: °1. Fever over 101.0 °  2. Nausea and/or vomiting °3. Extreme swelling or bruising °4. Continued bleeding from incision. °5. Increased pain, redness, or drainage from the incision. °The clinic staff is available to answer your  questions during regular business hours.  Please don’t hesitate to call and ask to speak to one of the nurses for clinical concerns.  If you have a medical emergency, go to the nearest emergency room or call 911.  A surgeon from [facility] is always on call at the hospital. °[address], [address], [place], [place]  [address] ? [address] [address], [place], [place]   [address] °[phone] ? [phone] ? FAX [phone] °Web site: [email or web address] ° °

## 2018-06-19 NOTE — Discharge Summary (Signed)
Physician Discharge Summary  Patient ID: [name] MRN: [id number] DOB/AGE: Apr 05, 1976 42 y.o.  Admit date: 06/18/2018 Discharge date: 06/19/2018  Admission Diagnoses: Breast cancer, dcis Left breast papilloma  Discharge Diagnoses:  Active Problems:   Breast neoplasm, Tis (DCIS), right  Discharged Condition: good  Hospital Course: 50 yof s/p right mastectomy and sn biopsy as well as seed guided left breast excision. Has done well with expected drain output, ready for dc  Consults: None  Significant Diagnostic Studies: none  Treatments: surgery: right srm, right ax sn biopsy, left seed guided excision    Disposition: Discharge disposition: 01-Home or Self Care       Discharge Instructions    Call MD for:  redness, tenderness, or signs of infection (pain, swelling, bleeding, redness, odor or green/yellow discharge around incision site)   Complete by:  As directed    Call MD for:  temperature >100.5   Complete by:  As directed    Discharge instructions   Complete by:  As directed    Ok to remove dressings and shower am 11.4.19. Soap and water ok, pat Tegaderms dry. Do not remove Tegaderms. No creams or ointments over incisions. Do not let drains dangle in shower, attach to lanyard or similar.Strip and record drains twice daily and bring log to clinic visit.  Breast binder or soft compression bra all other times.  Ok to raise arms above shoulders for bathing and dressing.  No house yard work or exercise until cleared by MD.   Recommend Miralax or Dulcolax as needed for constipation. Recommen ibuprofen with meals as directed to aid with pain control. It is also ok to use Tylenol.   Driving Restrictions   Complete by:  As directed    No driving for 2 weeks then no driving if taking narcotics   Lifting restrictions   Complete by:  As directed    No lifting > 5 lbs until cleared by MD.   Resume previous diet   Complete by:  As directed      Allergies as of  06/19/2018   No Known Allergies     Medication List    TAKE these medications   methocarbamol 500 MG tablet Commonly known as:  ROBAXIN Take 1 tablet (500 mg total) by mouth every 8 (eight) hours as needed for muscle spasms.   oxyCODONE 5 MG immediate release tablet Commonly known as:  Oxy IR/ROXICODONE Take 1 tablet (5 mg total) by mouth every 4 (four) hours as needed.   sulfamethoxazole-trimethoprim 800-160 MG tablet Commonly known as:  BACTRIM DS,SEPTRA DS Take 1 tablet by mouth 2 (two) times daily.      Follow-up Information    Irene Limbo, MD On 06/23/2018.   Specialty:  Plastic Surgery Why:  1 pm Contact information: [address] [place] [address] [phone]        Rolm Bookbinder, MD Follow up in 2 week(s).   Specialty:  General Surgery Contact information: [place] [address] [phone]           Signed: Rolm Bookbinder 06/19/2018, 12:48 PM

## 2018-06-19 NOTE — Progress Notes (Signed)
1 Day Post-Op   Subjective/Chief Complaint: Doing well, no complaints this am, tol diet   Objective: Vital signs in last 24 hours: Temp:  [97 F (36.1 C)-99.5 F (37.5 C)] 98.3 F (36.8 C) (11/03 0658) Pulse Rate:  [80-98] 91 (11/03 0658) Resp:  [18-24] 18 (11/03 0658) BP: (122-145)/(66-91) 132/77 (11/03 0658) SpO2:  [91 %-100 %] 99 % (11/03 0658) Weight:  [109.9 kg] 109.9 kg (11/02 1238)    Intake/Output from previous day: 11/02 0701 - 11/03 0700 In: 2509.7 [P.O.:360; I.V.:1599.7; IV Piggyback:550] Out: 325 [Drains:275; Blood:50] Intake/Output this shift: No intake/output data recorded.  Alert, oriented Right mastectomy site without hematoma, drain serosang, left breast incision clean without infection   Anti-infectives: Anti-infectives (From admission, onward)   Start     Dose/Rate Route Frequency Ordered Stop   06/18/18 1400  ceFAZolin (ANCEF) IVPB 2g/100 mL premix     2 g 200 mL/hr over 30 Minutes Intravenous Every 8 hours 06/18/18 1119     06/18/18 0848  bacitracin 50,000 Units, gentamicin (GARAMYCIN) 80 mg, ceFAZolin (ANCEF) 1 g in sodium chloride 0.9 % 1,000 mL  Status:  Discontinued       As needed 06/18/18 0848 06/18/18 1108   06/18/18 0730  bacitracin 50,000 Units, gentamicin (GARAMYCIN) 80 mg, ceFAZolin (ANCEF) 1 g in sodium chloride 0.9 % 1,000 mL  Status:  Discontinued      Irrigation Once 06/18/18 0723 06/18/18 1119   06/18/18 0700  ceFAZolin (ANCEF) 3 g in dextrose 5 % 50 mL IVPB     3 g 100 mL/hr over 30 Minutes Intravenous To [facility] 06/17/18 1226 06/18/18 0802   06/18/18 0000  sulfamethoxazole-trimethoprim (BACTRIM DS,SEPTRA DS) 800-160 MG tablet     1 tablet Oral 2 times daily 06/18/18 1059     06/17/18 1000  ceFAZolin (ANCEF) 3 g in dextrose 5 % 50 mL IVPB  Status:  Discontinued     3 g 100 mL/hr over 30 Minutes Intravenous To [facility] 06/16/18 1244 06/17/18 1226      Assessment/Plan: POD 1 right mastectomy/sn with expander,  left seed guided excisional biopsy  -regular diet -ambulate -if does well plan to dc home today -path pending  Rolm Bookbinder 06/19/2018

## 2018-06-20 ENCOUNTER — Encounter: Payer: Self-pay | Admitting: Women's Health

## 2018-06-20 ENCOUNTER — Encounter (HOSPITAL_COMMUNITY): Payer: Self-pay | Admitting: General Surgery

## 2018-06-20 NOTE — Anesthesia Postprocedure Evaluation (Addendum)
Anesthesia Post Note  Patient: [name]  Procedure(s) Performed: BREAST LUMPECTOMY WITH RADIOACTIVE SEED LOCALIZATION (Left Breast) RIGHT MASTECTOMY WITH SENTINEL LYMPH NODE BIOPSY (Right Breast) RIGHT BREAST RECONSTRUCTION WITH PLACEMENT OF TISSUE EXPANDER AND ALLODERM (Right )     Patient location during evaluation: PACU Anesthesia Type: General Level of consciousness: sedated and patient cooperative Pain management: pain level controlled Vital Signs Assessment: post-procedure vital signs reviewed and stable Respiratory status: spontaneous breathing Cardiovascular status: stable Anesthetic complications: no    Last Vitals:  Vitals:   06/19/18 0658 06/19/18 1141  BP: 132/77 124/87  Pulse: 91 (!) 101  Resp: 18 18  Temp: 36.8 C 36.9 C  SpO2: 99% 97%    Last Pain:  Vitals:   06/19/18 1141  TempSrc: Oral  PainSc:                  Nolon Nations

## 2018-06-21 NOTE — Addendum Note (Signed)
Addendum  created 06/21/18 1205 by Nolon Nations, MD   Intraprocedure Blocks edited, Sign clinical note

## 2018-06-28 NOTE — Progress Notes (Signed)
error 

## 2018-07-05 ENCOUNTER — Ambulatory Visit
Admission: RE | Admit: 2018-07-05 | Discharge: 2018-07-05 | Disposition: A | Discharge status: Home / Self Care | Payer: BC Managed Care – PPO | Source: Ambulatory Visit | Attending: Radiation Oncology | Admitting: Radiation Oncology

## 2018-07-05 ENCOUNTER — Telehealth: Payer: Self-pay

## 2018-07-05 ENCOUNTER — Ambulatory Visit: Payer: BC Managed Care – PPO

## 2018-07-05 DIAGNOSIS — D0511 Intraductal carcinoma in situ of right breast: Secondary | ICD-10-CM

## 2018-07-05 NOTE — Telephone Encounter (Signed)
I called [name] to inquire about her being late for her appointment with Dr. Isidore Moos today. She became very upset over the phone, crying and told me that she was told that she didn't have cancer after her recent surgery and was told she didn't need to see Radiation Oncology. She mentioned that she was seeing Dr. Donne Hazel tomorrow for follow up and I suggested that she talk to Dr. Donne Hazel about her future needs regarding her treatment tomorrow. I informed Dr. Isidore Moos of the above. She performed a quick chart review and observed that her disease was a precancerous type and all margins were negative after her recent surgery.  Dr. Isidore Moos called [name] and informed her of this. Dr. Isidore Moos plans to contact Dr. Donne Hazel and inform him of the above and make herself available if Dr. Donne Hazel feels like her input is necessary in the future.

## 2018-07-06 ENCOUNTER — Telehealth: Payer: Self-pay | Admitting: Hematology

## 2018-07-06 NOTE — Telephone Encounter (Signed)
Tried to reach regarding 12/6 °

## 2018-07-22 ENCOUNTER — Inpatient Hospital Stay: Payer: BC Managed Care – PPO | Attending: Hematology | Admitting: Hematology

## 2018-07-22 VITALS — BP 127/76 | HR 112 | Temp 99.0°F | Resp 17 | Ht 66.0 in | Wt 256.4 lb

## 2018-07-22 DIAGNOSIS — D0511 Intraductal carcinoma in situ of right breast: Secondary | ICD-10-CM | POA: Diagnosis not present

## 2018-07-22 DIAGNOSIS — Z8543 Personal history of malignant neoplasm of ovary: Secondary | ICD-10-CM

## 2018-07-22 DIAGNOSIS — Z9011 Acquired absence of right breast and nipple: Secondary | ICD-10-CM | POA: Diagnosis not present

## 2018-07-22 NOTE — Progress Notes (Signed)
[place]   Telephone:([phone]) [phone] Fax:(336) [phone]   Clinic Follow up Note   Patient Care Team: Huel Cote, NP as PCP - General (Obstetrics and Gynecology) Fanny Skates, MD as Consulting Physician (General Surgery) Truitt Merle, MD as Consulting Physician (Hematology) Eppie Gibson, MD as Attending Physician (Radiation Oncology)  Date of Service:  07/22/2018  CHIEF COMPLAINT: F/u of right breast cancer  SUMMARY OF ONCOLOGIC HISTORY: Oncology History   Cancer Staging Ductal carcinoma in situ (DCIS) of right breast Staging form: Breast, AJCC 8th Edition - Clinical stage from 03/30/2018: Stage 0 (cTis (DCIS), cN0, cM0, ER+, PR+, HER2: Not Assessed) - Signed by Truitt Merle, MD on 04/05/2018       Ductal carcinoma in situ (DCIS) of right breast   04/05/2017 Mammogram    04/05/2017 Diagnostic Mammogram  New 5 mm oval nodule in the left breast    04/05/2017 Breast US    04/05/2018 Breast US Likely benign 46m lobulated nodule in the left breast    03/28/2018 Mammogram    Diagnostic mammogram and breast [facility] on 03/28/2018, that revealed a suspicious 7 mm oval nodule in the left breast    03/30/2018 Cancer Staging    Staging form: Breast, AJCC 8th Edition - Clinical stage from 03/30/2018: Stage 0 (cTis (DCIS), cN0, cM0, ER+, PR+, HER2: Not Assessed) - Signed by [name] on 04/05/2018    03/30/2018 Receptors her2    ER 95% and PR 20%.     04/01/2018 Initial Diagnosis    Ductal carcinoma in situ (DCIS) of right breast    04/01/2018 Pathology Results    Diagnosis Breast, right, needle core biopsy - DUCTAL CARCINOMA IN SITU WITH CALCIFICATIONS. - FIBROADENOMA     Genetic Testing    Negative genetic testing on the [facility] Breast Cancer STAT panel + Common Hereditary Cancers Panel. The STAT Breast cancer panel offered by [facility] includes sequencing and rearrangement analysis for the following 9 genes:  ATM, BRCA1, BRCA2, CDH1, CHEK2, PALB2, PTEN, STK11 and  TP53. The Common Hereditary Cancers Panel offered by [facility] includes sequencing and/or deletion duplication testing of the following 47 genes: APC, ATM, AXIN2, BARD1, BMPR1A, BRCA1, BRCA2, BRIP1, CDH1, CDKN2A (p14ARF), CDKN2A (p16INK4a), CKD4, CHEK2, CTNNA1, DICER1, EPCAM (Deletion/duplication testing only), GREM1 (promoter region deletion/duplication testing only), KIT, MEN1, MLH1, MSH2, MSH3, MSH6, MUTYH, NBN, NF1, NHTL1, PALB2, PDGFRA, PMS2, POLD1, POLE, PTEN, RAD50, RAD51C, RAD51D, SDHB, SDHC, SDHD, SMAD4, SMARCA4. STK11, TP53, TSC1, TSC2, and VHL.  The following genes were evaluated for sequence changes only: SDHA and HOXB13 c.251G>A variant only.   Genetic testing did detect a Variant of Unknown Significance in the MSH6 gene called c.650A>G.  At this time, it is unknown if this variant is associated with increased cancer risk or if this is a normal finding, but most variants such as this get reclassified to being inconsequential. It should not be used to make medical management decisions.   The report date is 05/02/2018.    04/19/2018 Breast MRI    MRI Breast Bilateral 04/19/18  IMPRESSION: 1. Biopsy-proven DCIS within the upper-outer quadrant of the RIGHT breast, with associated biopsy clip artifact. Minimal enhancement at the biopsy site, compatible with known DCIS and/or post biopsy change. 2. Additional suspicious linear clumped non-mass enhancement within the lower central RIGHT breast, centered within the lower outer quadrant, nearly contiguous from middle depth to anterior depth, altogether measuring approximately 10 cm extent, with suspicious distribution. Recommend MRI-guided biopsies at the posterior and anterior extents.  3. Additional suspicious enhancing mass within the lower outer quadrant of the LEFT breast, at middle depth, measuring 8 mm. Also recommend MRI-guided biopsy for this LEFT breast mass. 4. Outside ultrasound of 03/28/2018 described a probably benign mass within the  LEFT breast at the 10 o'clock axis, 6 cm from the nipple. There is a corresponding enhancing mass identified on today's MRI within the upper inner quadrant, at anterior depth, measuring 7 mm (series 11, image 99). If MRI-guided biopsy of the LEFT breast mass (impression #3) is positive for malignancy, would then recommend ultrasound-guided biopsy of this additional LEFT breast mass at the 10 o'clock axis. 5. No evidence of metastatic lymphadenopathy.     05/11/2018 Pathology Results    Diagnosis 05/11/18  Breast, left, needle core biopsy, 10 o'clock, 6cmn - FIBROADENOMA. - NO MALIGNANCY IDENTIFIED. Microscopic Comment    06/18/2018 Surgery    BREAST LUMPECTOMY WITH RADIOACTIVE SEED LOCALIZATION and RIGHT MASTECTOMY WITH SENTINEL LYMPH NODE BIOPSY by Dr. Donne Hazel and RIGHT BREAST RECONSTRUCTION WITH PLACEMENT OF TISSUE EXPANDER AND ALLODERM by. Dr. Iran Planas  06/18/18    06/18/2018 Pathology Results    Diagnosis 06/18/18  1. Breast, lumpectomy, left with radioactive seed - USUAL DUCTAL HYPERPLASIA AND FIBROCYSTIC CHANGES INCLUDING APOCRINE METAPLASIA - PREVIOUS BIOPSY SITE CHANGES PRESENT - NO MALIGNANCY IDENTIFIED 2. Breast, simple mastectomy, right - DUCTAL CARCINOMA IN SITU, INTERMEDIATE NUCLEAR GRADE - MARGINS UNINVOLVED BY CARCINOMA (AT LEAST 1 CM; ANTERIOR MARGIN) - FIBROCYSTIC CHANGES WITH APOCRINE METAPLASIA - PREVIOUS BIOPSY SITE CHANGES - SEE ONCOLOGY TABLE AND COMMENT BELOW 3. Lymph node, sentinel, biopsy, right axillary - NO CARCINOMA IDENTIFIED IN ONE LYMPH NODE (0/1) 4. Lymph node, sentinel, biopsy, right - NO CARCINOMA IDENTIFIED IN ONE LYMPH NODE (0/1) Estrogen Receptor: Positive (100%, strong) Progesterone Receptor: Positive (5%, strong)      CURRENT THERAPY:  Surveillance   INTERVAL HISTORY:  [name] is here for a follow up post surgery. She presents to the clinic today by herself. She notes she is doing well post surgery with no pain. She does note  right flank soreness from draining tube. She plans to do reconstruction surgery with Dr. Iran Planas in summer 2020. She notes skin from incision sticking up.  She notes she had 2 boys in her early 14s with breast feeding.  She notes N& V from MRI in the past and is reluctant to do them again but is willing to do a Fast MRI.      REVIEW OF SYSTEMS:   Constitutional: Denies fevers, chills or abnormal weight loss Eyes: Denies blurriness of vision Ears, nose, mouth, throat, and face: Denies mucositis or sore throat Respiratory: Denies cough, dyspnea or wheezes Cardiovascular: Denies palpitation, chest discomfort or lower extremity swelling Gastrointestinal:  Denies nausea, heartburn or change in bowel habits Skin: Denies abnormal skin rashes Lymphatics: Denies new lymphadenopathy or easy bruising Neurological:Denies numbness, tingling or new weaknesses Behavioral/Psych: Mood is stable, no new changes  BREAST: (+) Right flank pain from draining tube  All other systems were reviewed with the patient and are negative.  MEDICAL HISTORY:  Past Medical History:  Diagnosis Date  . Anxiety   . Cancer [facility])    breast  . Family history of ovarian cancer   . Seasonal allergies     SURGICAL HISTORY: Past Surgical History:  Procedure Laterality Date  . BREAST LUMPECTOMY WITH RADIOACTIVE SEED LOCALIZATION Left 06/18/2018   Procedure: BREAST LUMPECTOMY WITH RADIOACTIVE SEED LOCALIZATION;  Surgeon: Rolm Bookbinder, MD;  Location: [place];  Service: General;  Laterality: Left;  . BREAST RECONSTRUCTION WITH PLACEMENT OF TISSUE EXPANDER AND ALLODERM Right 06/18/2018   Procedure: RIGHT BREAST RECONSTRUCTION WITH PLACEMENT OF TISSUE EXPANDER AND ALLODERM;  Surgeon: Irene Limbo, MD;  Location: [place];  Service: Plastics;  Laterality: Right;  . CESAREAN SECTION  2007   PRIMARY LTL, PREECLAMPSIA-UNFAVORABLE  . CESAREAN SECTION  08-27-09  . COLPOSCOPY    . HEMATOMA EVACUATION  2006   RIGHT LEG  .  MASTECTOMY W/ SENTINEL NODE BIOPSY Right 06/18/2018   Procedure: RIGHT MASTECTOMY WITH SENTINEL LYMPH NODE BIOPSY;  Surgeon: Rolm Bookbinder, MD;  Location: [place];  Service: General;  Laterality: Right;  . TUBAL LIGATION      I have reviewed the social history and family history with the patient and they are unchanged from previous note.  ALLERGIES:  has No Known Allergies.  MEDICATIONS:  No current outpatient medications on file.   No current facility-administered medications for this visit.     PHYSICAL EXAMINATION: ECOG PERFORMANCE STATUS: 0 - Asymptomatic  Vitals:   07/22/18 1355  BP: 127/76  Pulse: (!) 112  Resp: 17  Temp: 99 F (37.2 C)  SpO2: 99%   Filed Weights   07/22/18 1355  Weight: 256 lb 6.4 oz (116.3 kg)   GENERAL:alert, no distress and comfortable SKIN: skin color, texture, turgor are normal, no rashes or significant lesions EYES: normal, Conjunctiva are pink and non-injected, sclera clear OROPHARYNX:no exudate, no erythema and lips, buccal mucosa, and tongue normal  NECK: supple, thyroid normal size, non-tender, without nodularity LYMPH:  no palpable lymphadenopathy in the cervical, axillary or inguinal LUNGS: clear to auscultation and percussion with normal breathing effort HEART: regular rate & rhythm and no murmurs and no lower extremity edema ABDOMEN:abdomen soft, non-tender and normal bowel sounds Musculoskeletal:no cyanosis of digits and no clubbing  NEURO: alert & oriented x 3 with fluent speech, no focal motor/sensory deficits BREAST: (+) S/p right mastectomy and left lumpectomy: Surgical incision healed well  LABORATORY DATA:  I have reviewed the data as listed CBC Latest Ref Rng & Units 06/13/2018 04/06/2018 06/28/2017  WBC 4.0 - 10.5 K/uL 6.9 5.2 6.2  Hemoglobin 12.0 - 15.0 g/dL 12.6 12.8 13.0  Hematocrit 36.0 - 46.0 % 42.7 39.9 39.8  Platelets 150 - 400 K/uL 323 351 318     CMP Latest Ref Rng & Units 06/13/2018 04/06/2018 05/05/2016    Glucose 70 - 99 mg/dL 83 115(H) 85  BUN 6 - 20 mg/dL 10 9 11   Creatinine 0.44 - 1.00 mg/dL 0.79 0.86 0.77  Sodium 135 - 145 mmol/L 138 140 139  Potassium 3.5 - 5.1 mmol/L 3.6 3.9 4.1  Chloride 98 - 111 mmol/L 104 103 103  CO2 22 - 32 mmol/L 25 29 28   Calcium 8.9 - 10.3 mg/dL 9.2 9.6 9.2  Total Protein 6.5 - 8.1 g/dL - 8.0 7.2  Total Bilirubin 0.3 - 1.2 mg/dL - 0.7 0.8  Alkaline Phos 38 - 126 U/L - 106 80  AST 15 - 41 U/L - 11(L) 12  ALT 0 - 44 U/L - 13 14      RADIOGRAPHIC STUDIES: I have personally reviewed the radiological images as listed and agreed with the findings in the report. No results found.   ASSESSMENT & PLAN:  RANEISHA BRESS is a 41 y.o. female with   1. 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  Right breast biopsy showed DCIS, grade 2, left  breast biopsy was benign.  She underwent right breast mastectomy and left breast lumpectomy.  -She plans to have b/l reconstruction surgery in summer 2020.  -We discussed onset of DCIS is non-invasive, and has been cured by completed surgical resection.  -She does not need adjuvant radiation. -She still has moderate risk for breast cancer in her left breast.  She has no other high risks for breast cancer, such as family history, and her left breast biopsy was benign, no atypical hyperplasia.  I do not feel she needs chemoprevention such as tamoxifen. She is happy and relieved  -We also discussed the breast cancer surveillance after her surgery. She will continue annual screening mammogram, self exam, and breast exam by a physician twice a year -I also discussed following her more closely given her density C breast tissue and h/o left breast benign mass. I recommend alternating Fast Breast MRI  And mammogram every 6 months She is interested.  -Next mammogram 8.2020 and breast MRI in 09/2019.  -I encouraged her to maintain healthy lifestyle with healthy diet, exercise and watch her weight.  -F/u open,  she will follow-up with her gynecologist for breast cancer surveillance   2. 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    Plan -She will follow-up with her gynecologist for left breast cancer surveillance, I will see her as needed in the future.      No problem-specific Assessment & Plan notes found for this encounter.   No orders of the defined types were placed in this encounter.  All questions were answered. The patient knows to call the clinic with any problems, questions or concerns. No barriers to learning was detected. I spent 15 minutes counseling the patient face to face. The total time spent in the appointment was 20 minutes and more than 50% was on counseling and review of test results     Truitt Merle, MD 07/22/2018   I, Joslyn Devon, am acting as scribe for Truitt Merle, MD.   I have reviewed the above documentation for accuracy and completeness, and I agree with the above.

## 2018-07-23 ENCOUNTER — Encounter: Payer: Self-pay | Admitting: Hematology

## 2018-07-25 ENCOUNTER — Other Ambulatory Visit: Payer: Self-pay

## 2018-07-25 ENCOUNTER — Encounter: Payer: Self-pay | Admitting: Rehabilitation

## 2018-07-25 ENCOUNTER — Encounter: Payer: Self-pay | Admitting: *Deleted

## 2018-07-25 ENCOUNTER — Ambulatory Visit: Payer: BC Managed Care – PPO | Attending: Plastic Surgery | Admitting: Rehabilitation

## 2018-07-25 DIAGNOSIS — Z483 Aftercare following surgery for neoplasm: Secondary | ICD-10-CM | POA: Insufficient documentation

## 2018-07-25 DIAGNOSIS — M25611 Stiffness of right shoulder, not elsewhere classified: Secondary | ICD-10-CM | POA: Diagnosis present

## 2018-07-25 DIAGNOSIS — Z9011 Acquired absence of right breast and nipple: Secondary | ICD-10-CM | POA: Insufficient documentation

## 2018-07-25 NOTE — Patient Instructions (Signed)
gave pt post op stretches handout and strength ABC handout with verbal instruction only per pt request of 1 visit only.  Gave pt livestrong handout information and walking info as well

## 2018-07-25 NOTE — Therapy (Signed)
[place], [place], [address] Phone: [phone]   Fax:  [phone]  Physical Therapy Evaluation  Patient Details  Name: [name] MRN: [id number] Date of Birth: December 01, 1975 Referring Provider (PT): Dr. Iran Planas   Encounter Date: 07/25/2018  PT End of Session - 07/25/18 1328    Visit Number  1    PT Start Time  0930    PT Stop Time  1015    PT Time Calculation (min)  45 min    Activity Tolerance  Patient tolerated treatment well    Behavior During Therapy  [facility] for tasks assessed/performed       Past Medical History:  Diagnosis Date  . Anxiety   . Cancer [facility])    breast  . Family history of ovarian cancer   . Seasonal allergies     Past Surgical History:  Procedure Laterality Date  . BREAST LUMPECTOMY WITH RADIOACTIVE SEED LOCALIZATION Left 06/18/2018   Procedure: BREAST LUMPECTOMY WITH RADIOACTIVE SEED LOCALIZATION;  Surgeon: Rolm Bookbinder, MD;  Location: [place];  Service: General;  Laterality: Left;  . BREAST RECONSTRUCTION WITH PLACEMENT OF TISSUE EXPANDER AND ALLODERM Right 06/18/2018   Procedure: RIGHT BREAST RECONSTRUCTION WITH PLACEMENT OF TISSUE EXPANDER AND ALLODERM;  Surgeon: Irene Limbo, MD;  Location: [place];  Service: Plastics;  Laterality: Right;  . CESAREAN SECTION  2007   PRIMARY LTL, PREECLAMPSIA-UNFAVORABLE  . CESAREAN SECTION  08-27-09  . COLPOSCOPY    . HEMATOMA EVACUATION  2006   RIGHT LEG  . MASTECTOMY W/ SENTINEL NODE BIOPSY Right 06/18/2018   Procedure: RIGHT MASTECTOMY WITH SENTINEL LYMPH NODE BIOPSY;  Surgeon: Rolm Bookbinder, MD;  Location: [place];  Service: General;  Laterality: Right;  . TUBAL LIGATION      There were no vitals filed for this visit.   Subjective Assessment - 07/25/18 0936    Subjective  No problems     Pertinent History  no other health problems, see oncology section for summary     Patient Stated Goals  see if there is anything  I am supposed to be doing    Currently in Pain?  No/denies         [facility] PT Assessment - 07/25/18 0001      Assessment   Medical Diagnosis  Rt mastectomy and Lt lumpectomy     Referring Provider (PT)  Dr. Iran Planas    Onset Date/Surgical Date  06/18/18    Hand Dominance  Right    Prior Therapy  no      Precautions   Precaution Comments  lymphedema, cancer      Restrictions   Weight Bearing Restrictions  No    Other Position/Activity Restrictions  n      Balance Screen   Has the patient fallen in the past 6 months  No    Has the patient had a decrease in activity level because of a fear of falling?   No    Is the patient reluctant to leave their home because of a fear of falling?   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     Leisure  I would like to start now      Cognition   Overall Cognitive Status  Within Functional Limits for tasks assessed      Observation/Other Assessments   Observations  still wearing the binder from surgery     Skin Integrity  tightness upper and lateral expander site; healing incision and drain sites    Focus on Therapeutic Outcomes (FOTO)   no disability on QDASH      Sensation   Additional Comments  reports no n/t      Coordination   Gross Motor Movements are Fluid and Coordinated  Yes      Posture/Postural Control   Posture/Postural Control  No significant limitations      ROM / Strength   AROM / PROM / Strength  AROM;PROM;Strength      AROM   AROM Assessment Site  Shoulder    Right/Left Shoulder  Right;Left    Right Shoulder Flexion  165 Degrees    Right Shoulder ABduction  160 Degrees    Right Shoulder Internal Rotation  80 Degrees    Right Shoulder  External Rotation  80 Degrees    Right Shoulder Horizontal ABduction  40 Degrees    Left Shoulder Flexion  170 Degrees    Left Shoulder ABduction  170 Degrees    Left Shoulder Internal Rotation  80 Degrees    Left Shoulder External Rotation  80 Degrees    Left Shoulder Horizontal ABduction  40 Degrees      Strength   Overall Strength Comments  all strong and painfree        LYMPHEDEMA/ONCOLOGY QUESTIONNAIRE - 07/25/18 0954      Type   Cancer Type  Rt DCIS with mastectomy, L lumpectomy benign      Surgeries   Mastectomy Date  06/18/18    Lumpectomy Date  06/18/18    Saline Implant Reconstruction Date  --   will be in 2020   Sentinel Lymph Node Biopsy Date  06/18/18    Number Lymph Nodes Removed  1      Treatment   Active Chemotherapy Treatment  No    Past Chemotherapy Treatment  No    Active Radiation Treatment  No    Past Radiation Treatment  No    Current Hormone Treatment  No    Past Hormone Therapy  No      What other symptoms do you have   Are you Having Heaviness or Tightness  No    Are you having Pain  No      Lymphedema Assessments   Lymphedema Assessments  Upper extremities      Right Upper Extremity Lymphedema   10 cm Proximal to Olecranon Process  41.5 cm    Olecranon Process  31.3 cm    10 cm Proximal to Ulnar Styloid Process  27 cm    Just Proximal to Ulnar Styloid Process  18.7 cm    Across Hand at [employer]  21.4 cm    At [place] of 2nd Digit  7 cm      Left Upper Extremity Lymphedema   10 cm Proximal to Olecranon Process  41.5 cm    Olecranon Process  31.5 cm    10 cm Proximal to Ulnar Styloid Process  28 cm    Just Proximal to Ulnar Styloid Process  18 cm    Across Hand at [employer]  21.7 cm    At [place] of 2nd Digit  6.9 cm  Objective measurements completed on examination: See above findings.      [facility] Adult PT Treatment/Exercise - 07/25/18 0001      Self-Care   Self-Care  Other Self-Care Comments    Other  Self-Care Comments   discussed options for shapewear camisoles and prothesis inserts and wear to buy      Exercises   Exercises  Other Exercises    Other Exercises   gave pt post op stretches handout and strength ABC handout with verbal instruction only per pt request of 1 visit only.  Gave pt livestrong handout information and walking info as well              PT Education - 07/25/18 1328    Education Details  gave pt post op stretches handout and strength ABC handout with verbal instruction only per pt request of 1 visit only.  Gave pt livestrong handout information and walking info as well , lymphedema signs and symptoms and prevention handout     Person(s) Educated  Patient    Methods  Explanation;Demonstration;Verbal cues;Handout    Comprehension  Verbalized understanding          PT Long Term Goals - 07/25/18 1332      PT LONG TERM GOAL #1   Title  Pt will be educated on self care for stretching and strengthening at home and care for lymphedema prevention    Time  1    Period  Days    Status  Achieved             Plan - 07/25/18 1329    Clinical Impression Statement  Ercelle presents post Rt mastectomy with expander and Lt lumpectomy with no subjective limitations or reports of tightness.  She has mild pulling into the axilla with overhead flexion and abduction but no pain.  ROM WNL and strength good and painfree.  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  Pt was given information about post op stretches handout and strength ABC handout with verbal instruction only per pt request of 1 visit only.  Gave pt livestrong handout information and walking info as well . No signs of swelling    History and Personal Factors relevant to plan of care:  no other health problems    Clinical Presentation  Stable    Clinical Decision Making  Moderate    Rehab Potential  Excellent    PT Frequency  One time visit    PT Treatment/Interventions  ADLs/Self Care  Home Management;Therapeutic exercise    Consulted and Agree with Plan of Care  Patient       Patient will benefit from skilled therapeutic intervention in order to improve the following deficits and impairments:  Decreased skin integrity, Decreased range of motion, Decreased knowledge of precautions  Visit Diagnosis: Aftercare following surgery for neoplasm  Stiffness of right shoulder, not elsewhere classified  H/O right mastectomy     Problem List Patient Active Problem List   Diagnosis Date Noted  . Breast neoplasm, Tis (DCIS), right 06/18/2018  . Genetic testing 04/28/2018  . Family history of ovarian cancer   . Ductal carcinoma in situ (DCIS) of right breast 04/01/2018  . Diabetes mellitus   . Asthma   . LGSIL (low grade squamous intraepithelial dysplasia)     Shan Levans, PT 07/25/2018, 1:33 PM  [place], [place], [address] Phone: [phone]   Fax:  [phone]  Name:  [name] MRN: [id number] Date of Birth: 02/27/1976

## 2018-11-21 ENCOUNTER — Encounter: Payer: BC Managed Care – PPO | Admitting: Women's Health

## 2018-12-24 IMAGING — MG MM CLIP PLACEMENT
2 series · 2 of 2 positions shown · non-contrast
Comparison: Previous exam(s).

CLINICAL DATA: Evaluate placement of biopsy clips following 2 MR
guided biopsies of the RIGHT breast and 1 MR guided biopsy of the
LEFT breast. Recent diagnosis of RIGHT breast DCIS marked with a
BARBELL clip.

EXAM:
DIAGNOSTIC BILATERAL MAMMOGRAM POST MRI BIOPSY

[L CC]
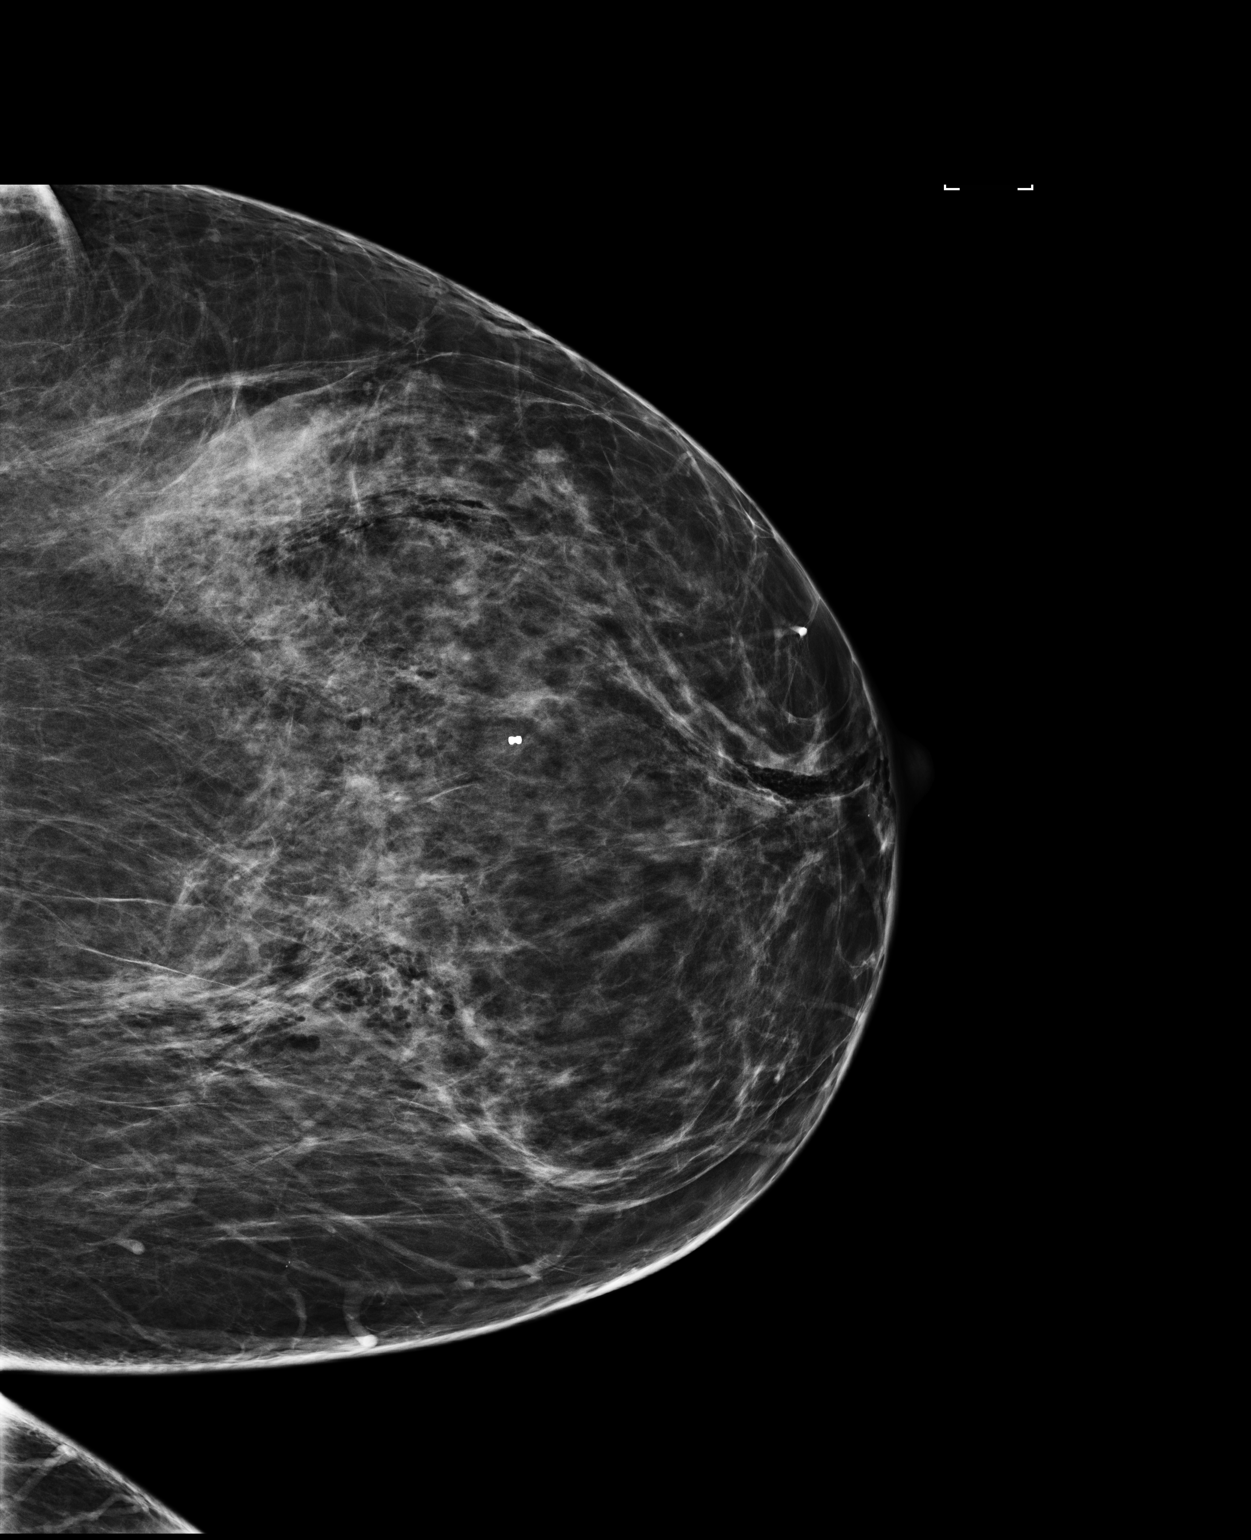

[L ML]
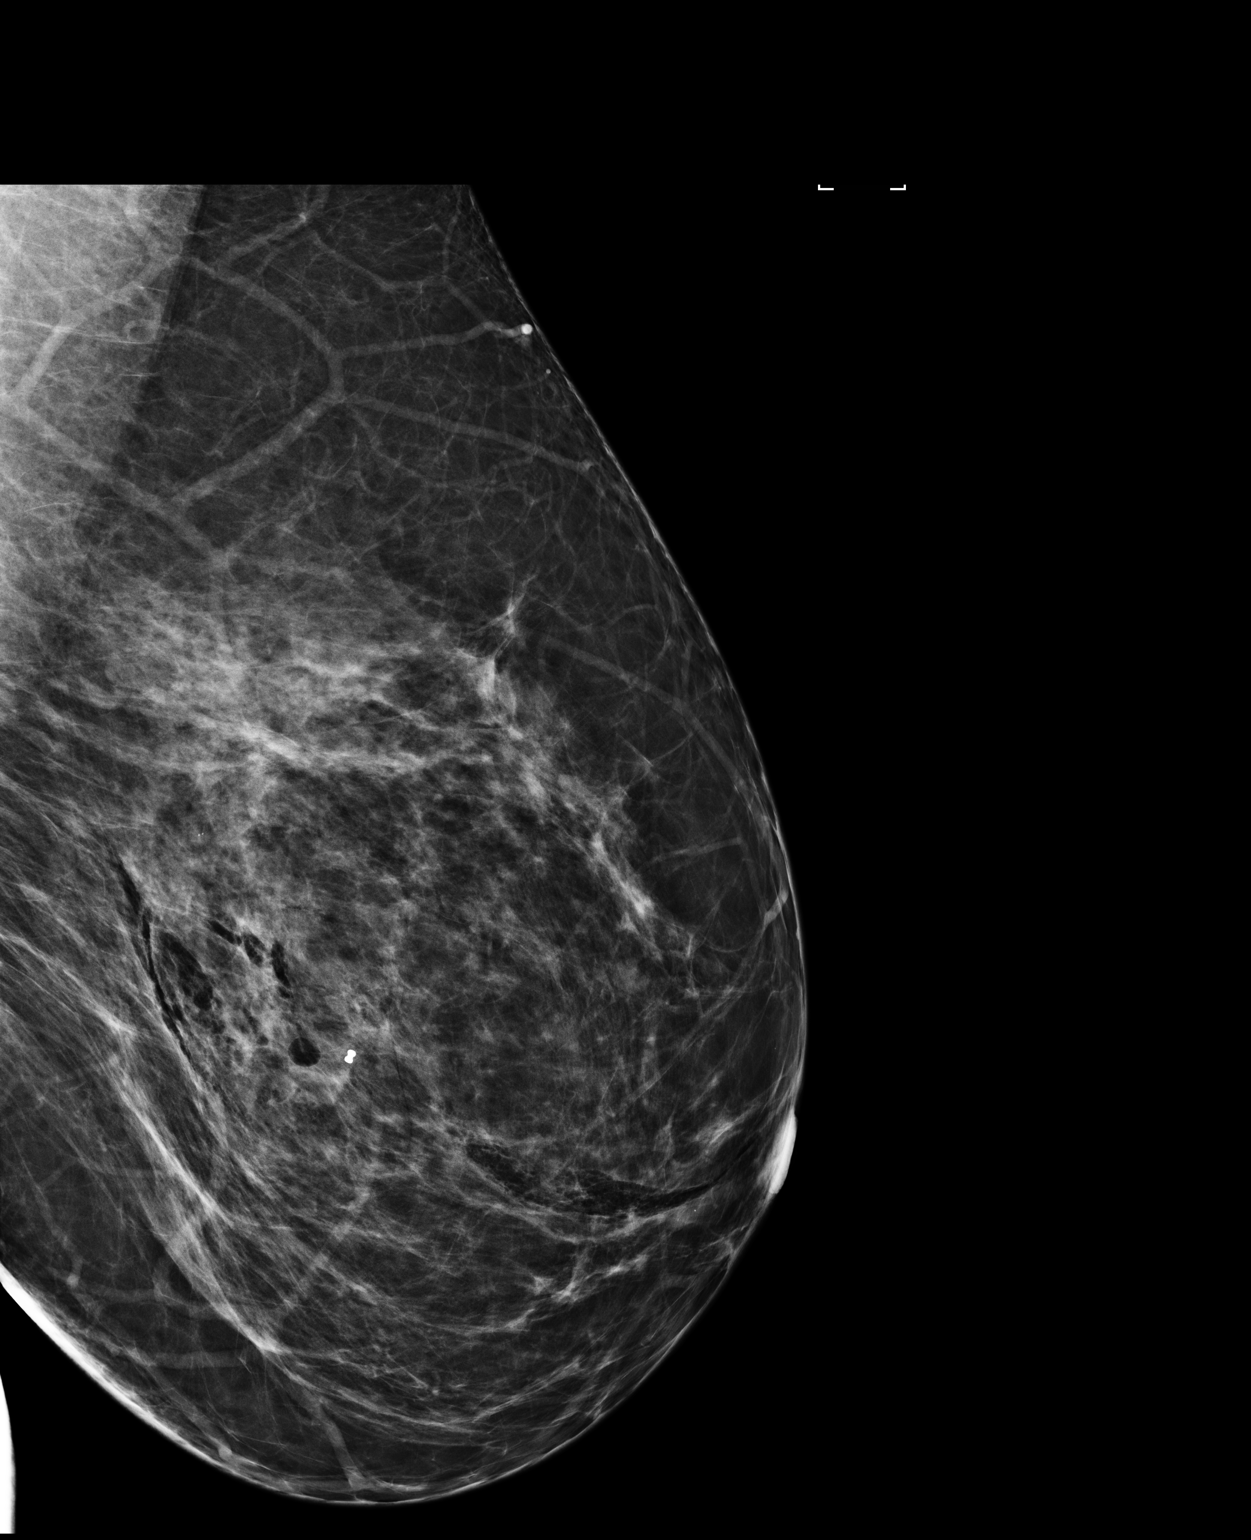

[2 of 2 positions shown; findings below may reference images not displayed]

FINDINGS: Mammographic images were obtained following MR guided biopsies of
the 8 mm mass within the LOWER OUTER central LEFT breast, and
anterior and posterior aspects of clumped enhancement within the
OUTER RIGHT breast..

LEFT BREAST:

The BARBELL clip is in satisfactory position corresponding to the 8
mm mass biopsied within the LOWER OUTER central LEFT breast.

RIGHT BREAST:

Please note that a existing BARBELL clip within the UPPER-OUTER
RIGHT breast is present demarcating biopsy-proven DCIS. Another
BARBELL clip was placed today within the RIGHT breast as no other
different shaped clip was available.

The BARBELL clip placed today corresponding to the posterior aspect
of clumped MR enhancement biopsied is in satisfactory position
within the posterior UPPER-OUTER RIGHT breast and lies 4.5 cm
posterior to the previously placed BARBELL clip in area of known
DCIS.

The CYLINDER clip placed today is in satisfactory position
corresponding to the anterior aspect of clumped MR enhancement
biopsied.

The BARBELL clip placed today and the CYLINDER clip are separated by
a distance of 7 cm.
IMPRESSION: Satisfactory position of BARBELL clip corresponding to the 8 mm mass
biopsied within the central LOWER OUTER LEFT breast.

Satisfactory position of CYLINDER and BARBELL clips corresponding to
the anterior and posterior aspects of clumped enhancement biopsied
within the OUTER RIGHT breast.

Please note existing RIGHT breast BARBELL clip corresponding to
biopsy-proven DCIS lies anterior to the BARBELL clip placed today.

BARBELL and CYLINDER clips placed today are separated by a distance
of 7 cm.

Final Assessment: Post Procedure Mammograms for Marker Placement

## 2019-01-23 ENCOUNTER — Encounter (HOSPITAL_BASED_OUTPATIENT_CLINIC_OR_DEPARTMENT_OTHER): Payer: Self-pay | Admitting: *Deleted

## 2019-01-23 ENCOUNTER — Other Ambulatory Visit: Payer: Self-pay

## 2019-01-23 NOTE — H&P (Signed)
Subjective:     Patient ID: [name] is a 43 y.o. female.  HPI  7 months post op right mastectomy with immediate expander ADM reconstruction. Returns today to plan next surgery, this has been delayed secondary to [place].  Presented following screening MMG with calcifications RIGHT breast and known 7 mm mass left breast that was stable in size since 2018. Biopsy right breast calcs revealed intermediate grade DCIS, ER/PR +.  MRI demonstrated biopsy-proven DCIS within the right UOQ. Additional linear NME spanned 10 cm extent. MRI-guided biopsies at the posterior and anterior extents of this enhancement demonstrated DCIS, ER/PR +.   Two LEFT breast biopsies to date: "left lower outer" with intraductal papilloma, "left 10 o clock 6cmfn" with fibroadenoma. Final pathology left lumpectomy with UDH, right mastectomy intermediate grade DCIS margins clear 0/2 SLN.  Genetics negative.  Prior 38D. 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  Review of Systems     Objective:   Physical Exam  Cardiovascular: Normal rate, regular rhythm and normal heart sounds.  Pulmonary/Chest: Effort normal and breath sounds normal.  Abdominal: Soft.   Chest: right chest expanded soft, some fullness lateral chest and depression contour at junction with axilla at UOQ Right upper abdomen polythelia Left periareolar scar with expected induration SN to nipple L 31 cm  BW 22 cm Nipple to IMF L 14 cm    Assessment:     DCIS right breast, Left breast papilloma Right polythelia S/p left lumpectomy, right SRM, prepectoral TE/ADM (Alloderm) reconstruction    Plan:   Last MMG-03/2018- if she proceeds with surgery prior to this time, counseled may have delay in completing next screening MMG due to healing from surgery and most patients unable to tolerate compression for few months.   Plan second stage surgery for  removal right chest TE and placement implant, left breast reduction. Over left breast reviewed anchor type scars, drain. Reviewed risks diminished sensation nipple and breast skin, risk of nipple loss, wound healing problems, asymmetry, incidental carcinoma, changes with wt gain/loss, aging- this includes asymmetry between this breast and implant side, latter will have more upper pole fullness, less ptosis than natural breast.  Reviewed saline vs silicone, smooth vs textured. Reviewed incidence ALCL with textured implants, purpose of texturing to keep implant position stable. As in prepectoral position I do recommend HCG or capacity filled silicone implants to reduce risk visible rippling. Reviewedultimate volume with implant will be larger than present but overall will be limited by CW size with regards to final implant selection. Reviewed MRI surveillance for rupture with silicone implants.Plan for smooth round capacity filled silicone.  Reviewed purpose lipofilling for thickening flaps, prevent visible rippling. Reviewed need for compression, donor site pain. Plan abdominal donor site. We also discussed liposuction right lateral chest.  Patient with right polythelia, will plan excision of this during procedure. Reviewed transverse scar, longer than lesion itself.  Plan OP surgery.  Natrelle 133S FV-13-T 500 ml tissue expander placed.  fill volume 500 ml saline.      Irene Limbo, MD [facility]astic [facility] Re[facility] Surgery [phone], pin [phone]

## 2019-01-24 ENCOUNTER — Ambulatory Visit: Payer: BC Managed Care – PPO | Admitting: Women's Health

## 2019-01-24 ENCOUNTER — Encounter: Payer: Self-pay | Admitting: Women's Health

## 2019-01-24 VITALS — BP 128/80 | Ht 66.0 in | Wt 265.0 lb

## 2019-01-24 DIAGNOSIS — Z01419 Encounter for gynecological examination (general) (routine) without abnormal findings: Secondary | ICD-10-CM | POA: Diagnosis not present

## 2019-01-24 NOTE — Progress Notes (Signed)
[name] 07/21/1976 [id number]    History:    Presents for annual exam.  Monthly cycle/BTL.  2010 LGSIL with a negative colposcopy and biopsy normal Paps after.  06/2018 right breast cancer diagnosed on mammogram, right breast mastectomy, left breast lumpectomy-papilloma, and is scheduled left breast reduction with right breast  expander removal and silicone implant placed 01/30/2019.  No family history of breast cancer, BRCA testing negative.  Past medical history, past surgical history, family history and social history were all reviewed and documented in the EPIC chart.  Ninth grade teacher.  Mother hypertension, both parents deceased natural causes..  History of GDM.  2 sons ages 75 and 43 both doing well.  ROS:  A ROS was performed and pertinent positives and negatives are included.  Exam:  Vitals:   01/24/19 1122  BP: 128/80  Weight: 265 lb (120.2 kg)  Height: 5' 6" (1.676 m)   Body mass index is 42.77 kg/m.   General appearance:  Normal Thyroid:  Symmetrical, normal in size, without palpable masses or nodularity. Respiratory  Auscultation:  Clear without wheezing or rhonchi Cardiovascular  Auscultation:  Regular rate, without rubs, murmurs or gallops  Edema/varicosities:  Not grossly evident Abdominal  Soft,nontender, without masses, guarding or rebound.  Liver/spleen:  No organomegaly noted  Hernia:  None appreciated  Skin  Inspection:  Grossly normal   Breasts: Examined lying and sitting.     Right: Mastectomy with expander present     Left: Without masses, retractions, discharge or axillary adenopathy. Gentitourinary   Inguinal/mons:  Normal without inguinal adenopathy  External genitalia:  Normal  BUS/Urethra/Skene's glands:  Normal  Vagina:  Normal  Cervix:  Normal  Uterus:   normal in size, shape and contour.  Midline and mobile  Adnexa/parametria:     Rt: Without masses or tenderness.   Lt: Without masses or tenderness.  Anus and  perineum: Normal  Digital rectal exam: Normal sphincter tone without palpated masses or tenderness  Assessment/Plan:  43 y.o. MBF G2 P2 for annual exam with no complaints.  Monthly cycle/BTL 2010 LGSIL with negative colposcopy normal Paps after 06/2018 right breast cancer mastectomy, left breast lumpectomy reconstructive surgery scheduled 6/15/ 2020 Obesity  Plan: Annual screening left breast mammogram, increase exercise and decrease calorie/carbs encouraged.  Self-care, leisure activities encouraged.  Pap with HR HPV typing, new screening guidelines reviewed, reviewed if Pap normal will start 3-year screening.  Has had numerous labs in the past year.  [place], 11:45 AM 01/24/2019

## 2019-01-24 NOTE — Patient Instructions (Signed)

## 2019-01-26 ENCOUNTER — Other Ambulatory Visit (HOSPITAL_COMMUNITY)
Admission: RE | Admit: 2019-01-26 | Discharge: 2019-01-26 | Disposition: A | Discharge status: Home / Self Care | Payer: BC Managed Care – PPO | Source: Ambulatory Visit | Attending: Plastic Surgery | Admitting: Plastic Surgery

## 2019-01-26 DIAGNOSIS — Z1159 Encounter for screening for other viral diseases: Secondary | ICD-10-CM | POA: Diagnosis not present

## 2019-01-26 LAB — PAP, TP IMAGING W/ HPV RNA, RFLX HPV TYPE 16,18/45: HPV DNA High Risk: NOT DETECTED

## 2019-01-27 LAB — NOVEL CORONAVIRUS, NAA (HOSP ORDER, SEND-OUT TO REF LAB; TAT 18-24 HRS): SARS-CoV-2, NAA: NOT DETECTED

## 2019-01-27 NOTE — Progress Notes (Signed)
Pts. Husband given hibiclins wash and ensure drink with instructions for both. NPO DOS otherwise. Verbalized understanding.

## 2019-01-30 ENCOUNTER — Ambulatory Visit (HOSPITAL_BASED_OUTPATIENT_CLINIC_OR_DEPARTMENT_OTHER)
Admission: RE | Admit: 2019-01-30 | Discharge: 2019-01-30 | Disposition: A | Discharge status: Home / Self Care | Payer: BC Managed Care – PPO | Attending: Plastic Surgery | Admitting: Plastic Surgery

## 2019-01-30 ENCOUNTER — Encounter (HOSPITAL_BASED_OUTPATIENT_CLINIC_OR_DEPARTMENT_OTHER)
Admission: RE | Disposition: A | Discharge status: Home / Self Care | Payer: Self-pay | Source: Home / Self Care | Attending: Plastic Surgery

## 2019-01-30 ENCOUNTER — Ambulatory Visit (HOSPITAL_BASED_OUTPATIENT_CLINIC_OR_DEPARTMENT_OTHER): Payer: BC Managed Care – PPO | Admitting: Anesthesiology

## 2019-01-30 ENCOUNTER — Encounter (HOSPITAL_BASED_OUTPATIENT_CLINIC_OR_DEPARTMENT_OTHER): Payer: Self-pay | Admitting: Anesthesiology

## 2019-01-30 ENCOUNTER — Other Ambulatory Visit: Payer: Self-pay

## 2019-01-30 DIAGNOSIS — Z421 Encounter for breast reconstruction following mastectomy: Secondary | ICD-10-CM | POA: Diagnosis not present

## 2019-01-30 DIAGNOSIS — Z79899 Other long term (current) drug therapy: Secondary | ICD-10-CM | POA: Diagnosis not present

## 2019-01-30 DIAGNOSIS — Z853 Personal history of malignant neoplasm of breast: Secondary | ICD-10-CM | POA: Diagnosis not present

## 2019-01-30 DIAGNOSIS — E119 Type 2 diabetes mellitus without complications: Secondary | ICD-10-CM | POA: Insufficient documentation

## 2019-01-30 HISTORY — PX: REMOVAL OF TISSUE EXPANDER AND PLACEMENT OF IMPLANT: SHX6457

## 2019-01-30 HISTORY — PX: BREAST REDUCTION SURGERY: SHX8

## 2019-01-30 HISTORY — PX: LIPOSUCTION WITH LIPOFILLING: SHX6436

## 2019-01-30 HISTORY — PX: EXCISION OF BREAST LESION: SHX6676

## 2019-01-30 SURGERY — MAMMOPLASTY, REDUCTION
Anesthesia: General | Site: Chest | Laterality: Right

## 2019-01-30 MED ORDER — SODIUM BICARBONATE 4 % IV SOLN
INTRAVENOUS | Status: DC | PRN
  Administered 2019-01-30: 400 mL via INTRAMUSCULAR

## 2019-01-30 MED ORDER — SCOPOLAMINE 1 MG/3DAYS TD PT72
1.0000 | MEDICATED_PATCH | TRANSDERMAL | Status: DC
  Administered 2019-01-30: 1.5 mg via TRANSDERMAL

## 2019-01-30 MED ORDER — SULFAMETHOXAZOLE-TRIMETHOPRIM 800-160 MG PO TABS: 1.0000 | ORAL_TABLET | Freq: Two times a day (BID) | ORAL | 0 refills | Status: DC

## 2019-01-30 MED ORDER — BUPIVACAINE-EPINEPHRINE (PF) 0.25% -1:200000 IJ SOLN
INTRAMUSCULAR | Status: AC
  Filled 2019-01-30: qty 30

## 2019-01-30 MED ORDER — ROCURONIUM BROMIDE 10 MG/ML (PF) SYRINGE
PREFILLED_SYRINGE | INTRAVENOUS | Status: DC | PRN
  Administered 2019-01-30: 30 mg via INTRAVENOUS
  Administered 2019-01-30: 50 mg via INTRAVENOUS

## 2019-01-30 MED ORDER — CHLORHEXIDINE GLUCONATE CLOTH 2 % EX PADS: 6.0000 | MEDICATED_PAD | Freq: Once | CUTANEOUS | Status: DC

## 2019-01-30 MED ORDER — GABAPENTIN 300 MG PO CAPS
ORAL_CAPSULE | ORAL | Status: AC
  Filled 2019-01-30: qty 1

## 2019-01-30 MED ORDER — CEFAZOLIN SODIUM-DEXTROSE 2-4 GM/100ML-% IV SOLN
INTRAVENOUS | Status: AC
  Filled 2019-01-30: qty 100

## 2019-01-30 MED ORDER — SUGAMMADEX SODIUM 200 MG/2ML IV SOLN
INTRAVENOUS | Status: AC
  Filled 2019-01-30: qty 2

## 2019-01-30 MED ORDER — MIDAZOLAM HCL 2 MG/2ML IJ SOLN
INTRAMUSCULAR | Status: AC
  Filled 2019-01-30: qty 2

## 2019-01-30 MED ORDER — ONDANSETRON HCL 4 MG/2ML IJ SOLN
INTRAMUSCULAR | Status: AC
  Filled 2019-01-30: qty 2

## 2019-01-30 MED ORDER — OXYCODONE HCL 5 MG PO TABS: 5.0000 mg | ORAL_TABLET | ORAL | 0 refills | Status: AC | PRN

## 2019-01-30 MED ORDER — CELECOXIB 200 MG PO CAPS
ORAL_CAPSULE | ORAL | Status: AC
  Filled 2019-01-30: qty 1

## 2019-01-30 MED ORDER — MIDAZOLAM HCL 2 MG/2ML IJ SOLN
1.0000 mg | INTRAMUSCULAR | Status: DC | PRN
  Administered 2019-01-30: 2 mg via INTRAVENOUS

## 2019-01-30 MED ORDER — PROMETHAZINE HCL 25 MG/ML IJ SOLN
6.2500 mg | Freq: Four times a day (QID) | INTRAMUSCULAR | Status: AC | PRN
  Administered 2019-01-30 (×2): 6.25 mg via INTRAVENOUS

## 2019-01-30 MED ORDER — FENTANYL CITRATE (PF) 100 MCG/2ML IJ SOLN
INTRAMUSCULAR | Status: AC
  Filled 2019-01-30: qty 2

## 2019-01-30 MED ORDER — SUGAMMADEX SODIUM 500 MG/5ML IV SOLN
INTRAVENOUS | Status: AC
  Filled 2019-01-30: qty 5

## 2019-01-30 MED ORDER — PROPOFOL 10 MG/ML IV BOLUS
INTRAVENOUS | Status: DC | PRN
  Administered 2019-01-30: 200 mg via INTRAVENOUS

## 2019-01-30 MED ORDER — ACETAMINOPHEN 500 MG PO TABS
1000.0000 mg | ORAL_TABLET | ORAL | Status: AC
  Administered 2019-01-30: 1000 mg via ORAL

## 2019-01-30 MED ORDER — BUPIVACAINE-EPINEPHRINE 0.25% -1:200000 IJ SOLN
INTRAMUSCULAR | Status: DC | PRN
  Administered 2019-01-30: 20 mL

## 2019-01-30 MED ORDER — LIDOCAINE 2% (20 MG/ML) 5 ML SYRINGE
INTRAMUSCULAR | Status: DC | PRN
  Administered 2019-01-30: 100 mg via INTRAVENOUS

## 2019-01-30 MED ORDER — METOCLOPRAMIDE HCL 5 MG/ML IJ SOLN: 10.0000 mg | Freq: Once | INTRAMUSCULAR | Status: DC | PRN

## 2019-01-30 MED ORDER — PROMETHAZINE HCL 25 MG/ML IJ SOLN
INTRAMUSCULAR | Status: AC
  Filled 2019-01-30: qty 1

## 2019-01-30 MED ORDER — LACTATED RINGERS IV SOLN
INTRAVENOUS | Status: DC
  Administered 2019-01-30 (×3): via INTRAVENOUS

## 2019-01-30 MED ORDER — SODIUM BICARBONATE 4.2 % IV SOLN
INTRAVENOUS | Status: AC
  Filled 2019-01-30: qty 20

## 2019-01-30 MED ORDER — LIDOCAINE 2% (20 MG/ML) 5 ML SYRINGE
INTRAMUSCULAR | Status: AC
  Filled 2019-01-30: qty 10

## 2019-01-30 MED ORDER — FENTANYL CITRATE (PF) 100 MCG/2ML IJ SOLN: 25.0000 ug | INTRAMUSCULAR | Status: DC | PRN

## 2019-01-30 MED ORDER — DEXAMETHASONE SODIUM PHOSPHATE 10 MG/ML IJ SOLN
INTRAMUSCULAR | Status: AC
  Filled 2019-01-30: qty 1

## 2019-01-30 MED ORDER — CELECOXIB 200 MG PO CAPS
200.0000 mg | ORAL_CAPSULE | ORAL | Status: AC
  Administered 2019-01-30: 200 mg via ORAL

## 2019-01-30 MED ORDER — MEPERIDINE HCL 25 MG/ML IJ SOLN: 6.2500 mg | INTRAMUSCULAR | Status: DC | PRN

## 2019-01-30 MED ORDER — SCOPOLAMINE 1 MG/3DAYS TD PT72
MEDICATED_PATCH | TRANSDERMAL | Status: AC
  Filled 2019-01-30: qty 1

## 2019-01-30 MED ORDER — ACETAMINOPHEN 500 MG PO TABS
ORAL_TABLET | ORAL | Status: AC
  Filled 2019-01-30: qty 2

## 2019-01-30 MED ORDER — LIDOCAINE HCL (PF) 1 % IJ SOLN
INTRAMUSCULAR | Status: AC
  Filled 2019-01-30: qty 60

## 2019-01-30 MED ORDER — METHOCARBAMOL 500 MG PO TABS: 500.0000 mg | ORAL_TABLET | Freq: Three times a day (TID) | ORAL | 0 refills | Status: DC | PRN

## 2019-01-30 MED ORDER — ROCURONIUM BROMIDE 10 MG/ML (PF) SYRINGE
PREFILLED_SYRINGE | INTRAVENOUS | Status: AC
  Filled 2019-01-30: qty 20

## 2019-01-30 MED ORDER — SUCCINYLCHOLINE CHLORIDE 200 MG/10ML IV SOSY
PREFILLED_SYRINGE | INTRAVENOUS | Status: AC
  Filled 2019-01-30: qty 10

## 2019-01-30 MED ORDER — SUGAMMADEX SODIUM 200 MG/2ML IV SOLN
INTRAVENOUS | Status: DC | PRN
  Administered 2019-01-30: 200 mg via INTRAVENOUS

## 2019-01-30 MED ORDER — DEXAMETHASONE SODIUM PHOSPHATE 4 MG/ML IJ SOLN
INTRAMUSCULAR | Status: DC | PRN
  Administered 2019-01-30: 10 mg via INTRAVENOUS

## 2019-01-30 MED ORDER — CEFAZOLIN SODIUM-DEXTROSE 2-4 GM/100ML-% IV SOLN
2.0000 g | INTRAVENOUS | Status: AC
  Administered 2019-01-30: 13:00:00 2 g via INTRAVENOUS

## 2019-01-30 MED ORDER — SCOPOLAMINE 1 MG/3DAYS TD PT72: 1.0000 | MEDICATED_PATCH | Freq: Once | TRANSDERMAL | Status: DC | PRN

## 2019-01-30 MED ORDER — SUCCINYLCHOLINE CHLORIDE 20 MG/ML IJ SOLN
INTRAMUSCULAR | Status: DC | PRN
  Administered 2019-01-30: 100 mg via INTRAVENOUS

## 2019-01-30 MED ORDER — SODIUM CHLORIDE 0.9 % IV SOLN
INTRAVENOUS | Status: DC | PRN
  Administered 2019-01-30: 1000 mL

## 2019-01-30 MED ORDER — GABAPENTIN 300 MG PO CAPS
300.0000 mg | ORAL_CAPSULE | ORAL | Status: AC
  Administered 2019-01-30: 300 mg via ORAL

## 2019-01-30 MED ORDER — ONDANSETRON HCL 4 MG/2ML IJ SOLN
INTRAMUSCULAR | Status: DC | PRN
  Administered 2019-01-30: 4 mg via INTRAVENOUS

## 2019-01-30 MED ORDER — PHENYLEPHRINE 40 MCG/ML (10ML) SYRINGE FOR IV PUSH (FOR BLOOD PRESSURE SUPPORT)
PREFILLED_SYRINGE | INTRAVENOUS | Status: AC
  Filled 2019-01-30: qty 20

## 2019-01-30 MED ORDER — FENTANYL CITRATE (PF) 100 MCG/2ML IJ SOLN
50.0000 ug | INTRAMUSCULAR | Status: AC | PRN
  Administered 2019-01-30: 25 ug via INTRAVENOUS
  Administered 2019-01-30: 50 ug via INTRAVENOUS
  Administered 2019-01-30: 100 ug via INTRAVENOUS
  Administered 2019-01-30: 50 ug via INTRAVENOUS

## 2019-01-30 MED ORDER — LACTATED RINGERS IV SOLN: INTRAVENOUS | Status: DC

## 2019-01-30 SURGICAL SUPPLY — 90 items
ADH SKN CLS APL DERMABOND .7 (GAUZE/BANDAGES/DRESSINGS) ×6
APL PRP STRL LF DISP 70% ISPRP (MISCELLANEOUS) ×6
BAG DECANTER FOR FLEXI CONT (MISCELLANEOUS) ×4 IMPLANT
BINDER ABDOMINAL 10 UNV 27-48 (MISCELLANEOUS) IMPLANT
BINDER ABDOMINAL 12 SM 30-45 (SOFTGOODS) ×1 IMPLANT
BINDER BREAST 3XL (GAUZE/BANDAGES/DRESSINGS) IMPLANT
BINDER BREAST XLRG (GAUZE/BANDAGES/DRESSINGS) IMPLANT
BINDER BREAST XXLRG (GAUZE/BANDAGES/DRESSINGS) ×1 IMPLANT
BLADE SURG 10 STRL SS (BLADE) ×15 IMPLANT
BLADE SURG 11 STRL SS (BLADE) ×4 IMPLANT
BLADE SURG 15 STRL LF DISP TIS (BLADE) IMPLANT
BLADE SURG 15 STRL SS (BLADE) ×4
BNDG GAUZE ELAST 4 BULKY (GAUZE/BANDAGES/DRESSINGS) ×8 IMPLANT
CANISTER LIPO FAT HARVEST (MISCELLANEOUS) ×1 IMPLANT
CANISTER SUCT 1200ML W/VALVE (MISCELLANEOUS) ×4 IMPLANT
CHLORAPREP W/TINT 26 (MISCELLANEOUS) ×8 IMPLANT
COVER BACK TABLE REUSABLE LG (DRAPES) ×4 IMPLANT
COVER MAYO STAND REUSABLE (DRAPES) ×8 IMPLANT
DECANTER SPIKE VIAL GLASS SM (MISCELLANEOUS) ×1 IMPLANT
DERMABOND ADVANCED (GAUZE/BANDAGES/DRESSINGS) ×2
DERMABOND ADVANCED .7 DNX12 (GAUZE/BANDAGES/DRESSINGS) ×6 IMPLANT
DRAIN CHANNEL 15F RND FF W/TCR (WOUND CARE) ×1 IMPLANT
DRAPE TOP ARMCOVERS (MISCELLANEOUS) ×4 IMPLANT
DRAPE U-SHAPE 76X120 STRL (DRAPES) ×4 IMPLANT
DRAPE UTILITY XL STRL (DRAPES) ×5 IMPLANT
DRSG PAD ABDOMINAL 8X10 ST (GAUZE/BANDAGES/DRESSINGS) ×8 IMPLANT
ELECT BLADE 4.0 EZ CLEAN MEGAD (MISCELLANEOUS) ×4
ELECT COATED BLADE 2.86 ST (ELECTRODE) ×4 IMPLANT
ELECT REM PT RETURN 9FT ADLT (ELECTROSURGICAL) ×4
ELECTRODE BLDE 4.0 EZ CLN MEGD (MISCELLANEOUS) ×3 IMPLANT
ELECTRODE REM PT RTRN 9FT ADLT (ELECTROSURGICAL) ×3 IMPLANT
EVACUATOR SILICONE 100CC (DRAIN) ×1 IMPLANT
GLOVE BIO SURGEON STRL SZ 6 (GLOVE) ×9 IMPLANT
GOWN STRL REUS W/ TWL LRG LVL3 (GOWN DISPOSABLE) ×6 IMPLANT
GOWN STRL REUS W/TWL LRG LVL3 (GOWN DISPOSABLE) ×12
IMPL BREAST GEL XFULL SHL 650 (Breast) IMPLANT
IMPL BRST GEL XFULL SHL 650CC (Breast) ×3 IMPLANT
IMPLANT BREAST GEL 650CC (Breast) ×4 IMPLANT
IV NS 500ML (IV SOLUTION)
IV NS 500ML BAXH (IV SOLUTION) ×3 IMPLANT
KIT FILL SYSTEM UNIVERSAL (SET/KITS/TRAYS/PACK) IMPLANT
LINER CANISTER 1000CC FLEX (MISCELLANEOUS) ×4 IMPLANT
MARKER SKIN DUAL TIP RULER LAB (MISCELLANEOUS) IMPLANT
NDL FILTER BLUNT 18X1 1/2 (NEEDLE) IMPLANT
NDL HYPO 25X1 1.5 SAFETY (NEEDLE) IMPLANT
NDL SAFETY ECLIPSE 18X1.5 (NEEDLE) ×3 IMPLANT
NEEDLE FILTER BLUNT 18X 1/2SAF (NEEDLE)
NEEDLE FILTER BLUNT 18X1 1/2 (NEEDLE) IMPLANT
NEEDLE HYPO 18GX1.5 SHARP (NEEDLE) ×4
NEEDLE HYPO 25X1 1.5 SAFETY (NEEDLE) ×4 IMPLANT
NS IRRIG 1000ML POUR BTL (IV SOLUTION) ×4 IMPLANT
PACK BASIN DAY SURGERY FS (CUSTOM PROCEDURE TRAY) ×4 IMPLANT
PAD ALCOHOL SWAB (MISCELLANEOUS) ×4 IMPLANT
PENCIL BUTTON HOLSTER BLD 10FT (ELECTRODE) ×4 IMPLANT
PIN SAFETY STERILE (MISCELLANEOUS) ×4 IMPLANT
SHEET MEDIUM DRAPE 40X70 STRL (DRAPES) ×8 IMPLANT
SIZER BREAST REUSE GEL 580CC (SIZER) ×4
SIZER BREAST REUSE XFP 615CC (SIZER) ×4
SIZER BREAST REUSE XFP 650CC (SIZER) ×4
SIZER BRST REUSE GEL 580CC (SIZER) IMPLANT
SIZER BRST REUSE XFP 615CC (SIZER) IMPLANT
SIZER BRST REUSE XFP 650CC (SIZER) IMPLANT
SLEEVE SCD COMPRESS KNEE MED (MISCELLANEOUS) ×4 IMPLANT
SPONGE LAP 18X18 RF (DISPOSABLE) ×9 IMPLANT
STAPLER VISISTAT 35W (STAPLE) ×7 IMPLANT
SUT ETHILON 2 0 FS 18 (SUTURE) ×7 IMPLANT
SUT MNCRL AB 3-0 PS2 18 (SUTURE) ×1 IMPLANT
SUT MNCRL AB 4-0 PS2 18 (SUTURE) ×15 IMPLANT
SUT PDS AB 2-0 CT2 27 (SUTURE) IMPLANT
SUT SILK 2 0 SH (SUTURE) IMPLANT
SUT VIC AB 3-0 PS1 18 (SUTURE) ×12
SUT VIC AB 3-0 PS1 18XBRD (SUTURE) ×12 IMPLANT
SUT VIC AB 3-0 SH 27 (SUTURE) ×8
SUT VIC AB 3-0 SH 27X BRD (SUTURE) ×3 IMPLANT
SUT VICRYL 4-0 PS2 18IN ABS (SUTURE) ×8 IMPLANT
SYR 10ML LL (SYRINGE) ×12 IMPLANT
SYR 20CC LL (SYRINGE) IMPLANT
SYR 50ML LL SCALE MARK (SYRINGE) ×4 IMPLANT
SYR BULB IRRIGATION 50ML (SYRINGE) ×8 IMPLANT
SYR CONTROL 10ML LL (SYRINGE) ×1 IMPLANT
SYR TB 1ML LL NO SAFETY (SYRINGE) ×4 IMPLANT
SYRINGE TOOMEY DISP (SYRINGE) IMPLANT
TAPE MEASURE VINYL STERILE (MISCELLANEOUS) IMPLANT
TOWEL GREEN STERILE FF (TOWEL DISPOSABLE) ×8 IMPLANT
TRAY FOLEY W/BAG SLVR 14FR LF (SET/KITS/TRAYS/PACK) IMPLANT
TUBE CONNECTING 20X1/4 (TUBING) ×8 IMPLANT
TUBING INFILTRATION IT-10001 (TUBING) ×4 IMPLANT
TUBING SET GRADUATE ASPIR 12FT (MISCELLANEOUS) ×4 IMPLANT
UNDERPAD 30X30 (UNDERPADS AND DIAPERS) ×8 IMPLANT
YANKAUER SUCT BULB TIP NO VENT (SUCTIONS) ×4 IMPLANT

## 2019-01-30 NOTE — Interval H&P Note (Signed)
History and Physical Interval Note:  01/30/2019 12:18 PM  [name]  has presented today for surgery, with the diagnosis of history breast cancer, acquired absence breasts.  The various methods of treatment have been discussed with the patient and family. After consideration of risks, benefits and other options for treatment, the patient has consented to  Left breast reduction, removal right chest tissue expander and placement silicone implant, lipofilling right chest, excision right polythelia  as a surgical intervention.  The patient's history has been reviewed, patient examined, no change in status, stable for surgery.  I have reviewed the patient's chart and labs.  Questions were answered to the patient's satisfaction.     Arnoldo Hooker Jannice Beitzel

## 2019-01-30 NOTE — Anesthesia Procedure Notes (Signed)
Procedure Name: Intubation Date/Time: 01/30/2019 1:12 PM Performed by: Eulas Post, Itali Mckendry W, CRNA Pre-anesthesia Checklist: Patient identified, Emergency Drugs available, Suction available and Patient being monitored Patient Re-evaluated:Patient Re-evaluated prior to induction Oxygen Delivery Method: Circle system utilized Preoxygenation: Pre-oxygenation with 100% oxygen Induction Type: IV induction Ventilation: Mask ventilation without difficulty Laryngoscope Size: Miller and 2 Grade View: Grade I Tube type: Oral Tube size: 7.0 mm Number of attempts: 1 Airway Equipment and Method: Stylet Placement Confirmation: ETT inserted through vocal cords under direct vision,  positive ETCO2 and breath sounds checked- equal and bilateral Secured at: 21 cm Tube secured with: Tape Dental Injury: Teeth and Oropharynx as per pre-operative assessment

## 2019-01-30 NOTE — Transfer of Care (Signed)
Immediate Anesthesia Transfer of Care Note  Patient: [name]  Procedure(s) Performed: LEFT BREAST REDUCTION (Left Breast) REMOVAL OF RIGHT TISSUE EXPANDER AND PLACEMENT OF SILICONE IMPLANT (Right Breast) LIPOSUCTION WITH LIPOFILLING TO RIGHT CHEST (Right Chest) EXCISION OF RIGHT CHEST POLYTHELIA (Right Breast)  Patient Location: PACU  Anesthesia Type:General  Level of Consciousness: awake, alert , oriented and patient cooperative  Airway & Oxygen Therapy: Patient Spontanous Breathing and Patient connected to face mask oxygen  Post-op Assessment: Report given to RN and Post -op Vital signs reviewed and stable  Post vital signs: Reviewed and stable  Last Vitals:  Vitals Value Taken Time  BP 128/77 01/30/19 1638  Temp    Pulse 86 01/30/19 1639  Resp 20 01/30/19 1639  SpO2 100 % 01/30/19 1639  Vitals shown include unvalidated device data.  Last Pain:  Vitals:   01/30/19 1152  TempSrc: Oral  PainSc: 0-No pain         Complications: No apparent anesthesia complications

## 2019-01-30 NOTE — Op Note (Signed)
Operative Note   DATE OF OPERATION: 6.15.20  LOCATION:  [facility]  SURGICAL DIVISION: Plastic Surgery  PREOPERATIVE DIAGNOSES:  1. History breast cancer 2. Acquired absence breast   POSTOPERATIVE DIAGNOSES:  same  PROCEDURE:  1. Removal right chest tissue expander and placement silicone implant 2. Lipofilling to right chest 3. Left breast reduction 4. Excision polythelia right upper abdomen   SURGEON: Irene Limbo MD MBA  ASSISTANT: none  ANESTHESIA:  General.   EBL: 992 ml  COMPLICATIONS: None immediate.   INDICATIONS FOR PROCEDURE:  The patient, [name], is a 42 y.o. female born on 24-Jun-1976, is here for staged breast reconstruction following right skin reduction mastectomy with immediate prepectoral expander acellular dermis reconstruction.    FINDINGS: Complete incorporation ADM noted. Natrelle Inspira Smooth Round Extra Projection 650 ml implant placed, REF SRX-650 [id number] Left breast reduction 579 g  DESCRIPTION OF PROCEDURE:  The patient's was marked in the preoperative areato include chest midline anterior axillary lines and breast meridian. The left  inframammary fold was transferred onto anterior surface breast by palpation. With aide Wise pattern marker, location of nipple areolar complex marked. Vertical limbs marked by medial and lateral displacement breast against meridian. 8 cm vertical limbs marked.Bilateral lateral flanks marked for donor site fat grafting and areas of depression right axilla marked for fat grafting The patientwas taken to the operating room. SCDs were placed and IV antibiotics were given. The patient's operative site was prepped and draped in a sterile fashion. A time out was performed and all information was confirmed to be correct.Incision made in right chest inframammary fold mastectomy scar and carried to acellular dermis. This was incised, full incorporation ADM noted. Sizer placed in cavity and tailor tacked  closed.   I then directed attention to left breast. IMF marked and lateral limbs resection marked. NAC marked with 45 mm cookie cutter and incised. Superior medial pedicle deepithelialized and developed toward chest wall. Breast tissue excised from lower pole, superior pole, and lateral breast. Cavity irrigated and hemostasis obtained. Local anesthetic infiltrated. 15 Fr JP placed and secured with 2-0 nylon. Patient tailor tacked closed and brought to upright sitting position. An Extra Projection 650  ml implant was selected for right chest. Patient returned to supine position. Left breast closure completed with 3-0 vicryl in dermis of vertical limb and inframammary fold. NAC inset with 4-0 vicryl in dermis. Skin closure completed with 4-0 monocryl subcuticular.  Stab incision made over bilateral lateral abdomen. Over left hemiabdomen, brisk bleeding encountered upon incision and incision extended to control bleeding. Tumescent fluid infiltrated over bilateral flanks and right lateral chest wall, total 250 ml. Power assisted liposuction performed to endpoint symmetric soft tissue thickness. Fat washed with saline and separated by gravity. Fat infiltrated in subcutaneous plane right axilla and mastectomy flap, total 55 ml. Abdominal incision approximated with interrupted 4-0 monocryl over right. Over left abdomen, incision closed with 3-0 monocryl in dermis and running 4-0 monocryl skin closure.   Over right upper abdomen, polythelia excised sharply diameter 1 cm. Layered closure completed with 3-0 monocryl in dermis and 4-0 monocryl subcuticular, length 1.5 cm.  Over right chest, cavity irrigated with saline solution containing Ancef, gentamicin, and bacitracin. Hemostasis ensured. Cavity irrigated with Betadine. Implant placed in cavity and orientation implant ensured. Closure completed with 3-0 vicryl for approximation acellular dermis and superficial fascia. 4-0 vicryl used to approximate dermis, 4-0  monocryl subcuticular skin closure completed. Tissue adhesive applied to chest and upper abdomen incisions.  Dry dressing and breast and abdominal binders applied.The patient was allowed to wakefromanesthesia, extubatedand taken to the recovery room in satisfactory condition  The patient was allowed to wake from anesthesia, extubated and taken to the recovery room in satisfactory condition.   SPECIMENS: right polythelia, left breast reduction  DRAINS: 15 Fr JP left breast  Irene Limbo, MD [facility] Plastic & Reconstructive Surgery [phone], pin [phone]

## 2019-01-30 NOTE — Anesthesia Preprocedure Evaluation (Signed)
Anesthesia Evaluation  Patient identified by MRN, date of birth, ID band Patient awake    Reviewed: Allergy & Precautions, NPO status , Patient's Chart, lab work & pertinent test results  Airway Mallampati: II  TM Distance: >3 FB Neck ROM: Full    Dental no notable dental hx.    Pulmonary neg pulmonary ROS,    Pulmonary exam normal breath sounds clear to auscultation       Cardiovascular negative cardio ROS Normal cardiovascular exam Rhythm:Regular Rate:Normal     Neuro/Psych negative neurological ROS  negative psych ROS   GI/Hepatic negative GI ROS, Neg liver ROS,   Endo/Other  Morbid obesity  Renal/GU negative Renal ROS  negative genitourinary   Musculoskeletal negative musculoskeletal ROS (+)   Abdominal   Peds negative pediatric ROS (+)  Hematology negative hematology ROS (+)   Anesthesia Other Findings   Reproductive/Obstetrics negative OB ROS                             Anesthesia Physical Anesthesia Plan  ASA: II  Anesthesia Plan: General   Post-op Pain Management:    Induction: Intravenous  PONV Risk Score and Plan: 3 and Ondansetron, Dexamethasone, Midazolam and Treatment may vary due to age or medical condition  Airway Management Planned: LMA  Additional Equipment:   Intra-op Plan:   Post-operative Plan: Extubation in OR  Informed Consent: I have reviewed the patients History and Physical, chart, labs and discussed the procedure including the risks, benefits and alternatives for the proposed anesthesia with the patient or authorized representative who has indicated his/her understanding and acceptance.     Dental advisory given  Plan Discussed with: CRNA  Anesthesia Plan Comments:         Anesthesia Quick Evaluation

## 2019-01-30 NOTE — Discharge Instructions (Signed)

## 2019-01-31 ENCOUNTER — Encounter (HOSPITAL_BASED_OUTPATIENT_CLINIC_OR_DEPARTMENT_OTHER): Payer: Self-pay | Admitting: Plastic Surgery

## 2019-01-31 NOTE — Anesthesia Postprocedure Evaluation (Signed)
Anesthesia Post Note  Patient: [name]  Procedure(s) Performed: LEFT BREAST REDUCTION (Left Breast) REMOVAL OF RIGHT TISSUE EXPANDER AND PLACEMENT OF SILICONE IMPLANT (Right Breast) LIPOSUCTION WITH LIPOFILLING TO RIGHT CHEST (Right Chest) EXCISION OF RIGHT CHEST POLYTHELIA (Right Breast)     Patient location during evaluation: PACU Anesthesia Type: General Level of consciousness: awake and alert Pain management: pain level controlled Vital Signs Assessment: post-procedure vital signs reviewed and stable Respiratory status: spontaneous breathing, nonlabored ventilation, respiratory function stable and patient connected to nasal cannula oxygen Cardiovascular status: blood pressure returned to baseline and stable Postop Assessment: no apparent nausea or vomiting Anesthetic complications: no    Last Vitals:  Vitals:   01/30/19 1800 01/30/19 1830  BP: (!) 148/98 (!) 150/90  Pulse: 84 87  Resp: (!) 24 18  Temp:  36.5 C  SpO2: 100% 100%    Last Pain:  Vitals:   01/31/19 1008  TempSrc:   PainSc: 1                  Aayliah Rotenberry S

## 2019-07-04 ENCOUNTER — Encounter: Payer: Self-pay | Admitting: Women's Health

## 2020-09-26 ENCOUNTER — Encounter: Payer: Self-pay | Admitting: Licensed Clinical Social Worker

## 2020-09-26 NOTE — Progress Notes (Signed)
UPDATE: VUS in M[facility] called [id number] has been reclassified to "Likely Benign." The report date is 09/26/2020.

## 2021-06-26 ENCOUNTER — Encounter: Payer: Self-pay | Admitting: Gastroenterology

## 2021-08-05 ENCOUNTER — Encounter: Payer: Self-pay | Admitting: Gastroenterology

## 2021-08-05 ENCOUNTER — Ambulatory Visit (AMBULATORY_SURGERY_CENTER): Payer: BC Managed Care – PPO

## 2021-08-05 ENCOUNTER — Other Ambulatory Visit: Payer: Self-pay

## 2021-08-05 VITALS — Ht 66.0 in | Wt 258.0 lb

## 2021-08-05 DIAGNOSIS — Z1211 Encounter for screening for malignant neoplasm of colon: Secondary | ICD-10-CM

## 2021-08-05 MED ORDER — NA SULFATE-K SULFATE-MG SULF 17.5-3.13-1.6 GM/177ML PO SOLN: 1.0000 | Freq: Once | ORAL | 0 refills | Status: AC

## 2021-08-05 NOTE — Progress Notes (Signed)
° ° °  Patient's pre-visit was done today over the phone with the patient   Name,DOB and address verified.   Patient denies any allergies to Eggs and Soy.  Patient denies any problems with anesthesia/sedation. Patient denies taking diet pills or blood thinners.  Denies atrial flutter or atrial fib Denies chronic constipation No home Oxygen.   Packet of Prep instructions mailed to patient including a copy of a consent form-pt is aware.  Patient understands to call us back with any questions or concerns.  Patient is aware of our care-partner policy and [id number] safety protocol.   EMMI education assigned to the patient for the procedure, sent to [place].

## 2021-08-14 ENCOUNTER — Encounter: Payer: Self-pay | Admitting: Gastroenterology

## 2021-08-14 ENCOUNTER — Ambulatory Visit (AMBULATORY_SURGERY_CENTER): Payer: BC Managed Care – PPO | Admitting: Gastroenterology

## 2021-08-14 VITALS — BP 172/100 | HR 78 | Temp 97.5°F | Resp 22 | Ht 66.0 in | Wt 258.0 lb

## 2021-08-14 DIAGNOSIS — D124 Benign neoplasm of descending colon: Secondary | ICD-10-CM

## 2021-08-14 DIAGNOSIS — K635 Polyp of colon: Secondary | ICD-10-CM

## 2021-08-14 DIAGNOSIS — Z1211 Encounter for screening for malignant neoplasm of colon: Secondary | ICD-10-CM | POA: Diagnosis present

## 2021-08-14 DIAGNOSIS — D128 Benign neoplasm of rectum: Secondary | ICD-10-CM

## 2021-08-14 DIAGNOSIS — D125 Benign neoplasm of sigmoid colon: Secondary | ICD-10-CM

## 2021-08-14 DIAGNOSIS — K621 Rectal polyp: Secondary | ICD-10-CM

## 2021-08-14 DIAGNOSIS — D127 Benign neoplasm of rectosigmoid junction: Secondary | ICD-10-CM | POA: Diagnosis not present

## 2021-08-14 MED ORDER — SODIUM CHLORIDE 0.9 % IV SOLN: 500.0000 mL | Freq: Once | INTRAVENOUS | Status: DC

## 2021-08-14 NOTE — Progress Notes (Signed)
GASTROENTEROLOGY PROCEDURE H&P NOTE   Primary Care Physician: Huel Cote, NP (Inactive)  HPI: [name] is a 45 y.o. female who presents for colonoscopy for screening.  Past Medical History:  Diagnosis Date   Anxiety    Cancer ([place])    breast   Diabetes mellitus without complication ([place])    Family history of ovarian cancer    Seasonal allergies    Past Surgical History:  Procedure Laterality Date   BREAST LUMPECTOMY WITH RADIOACTIVE SEED LOCALIZATION Left 06/18/2018   Procedure: BREAST LUMPECTOMY WITH RADIOACTIVE SEED LOCALIZATION;  Surgeon: Rolm Bookbinder, MD;  Location: [facility];  Service: General;  Laterality: Left;   BREAST RECONSTRUCTION WITH PLACEMENT OF TISSUE EXPANDER AND ALLODERM Right 06/18/2018   Procedure: RIGHT BREAST RECONSTRUCTION WITH PLACEMENT OF TISSUE EXPANDER AND ALLODERM;  Surgeon: Irene Limbo, MD;  Location: [facility];  Service: Plastics;  Laterality: Right;   BREAST REDUCTION SURGERY Left 01/30/2019   Procedure: LEFT BREAST REDUCTION;  Surgeon: Irene Limbo, MD;  Location: [facility];  Service: Plastics;  Laterality: Left;   CESAREAN SECTION  2007   PRIMARY LTL, PREECLAMPSIA-UNFAVORABLE   CESAREAN SECTION  08-27-09   COLPOSCOPY     EXCISION OF BREAST LESION Right 01/30/2019   Procedure: EXCISION OF RIGHT CHEST POLYTHELIA;  Surgeon: Irene Limbo, MD;  Location: [facility];  Service: Plastics;  Laterality: Right;   HEMATOMA EVACUATION  2006   RIGHT LEG   LIPOSUCTION WITH LIPOFILLING Right 01/30/2019   Procedure: LIPOSUCTION WITH LIPOFILLING TO RIGHT CHEST;  Surgeon: Irene Limbo, MD;  Location: [facility];  Service: Plastics;  Laterality: Right;   MASTECTOMY W/ SENTINEL NODE BIOPSY Right 06/18/2018   Procedure: RIGHT MASTECTOMY WITH SENTINEL LYMPH NODE BIOPSY;  Surgeon: Rolm Bookbinder, MD;  Location: [facility];  Service: General;  Laterality: Right;   REMOVAL OF TISSUE EXPANDER AND PLACEMENT  OF IMPLANT Right 01/30/2019   Procedure: REMOVAL OF RIGHT TISSUE EXPANDER AND PLACEMENT OF SILICONE IMPLANT;  Surgeon: Irene Limbo, MD;  Location: [facility];  Service: Plastics;  Laterality: Right;   TUBAL LIGATION     Current Outpatient Medications  Medication Sig Dispense Refill   metFORMIN (GLUCOPHAGE-XR) 500 MG 24 hr tablet Take by mouth.     Multiple Vitamin (MULTIVITAMIN) tablet Take 1 tablet by mouth daily.     Semaglutide,0.25 or 0.5MG /DOS, 2 MG/1.5ML SOPN Inject into the skin. (Patient not taking: Reported on 08/14/2021)     Current Facility-Administered Medications  Medication Dose Route Frequency Provider Last Rate Last Admin   0.9 %  sodium chloride infusion  500 mL Intravenous Once Mansouraty, Telford Nab., MD        Current Outpatient Medications:    metFORMIN (GLUCOPHAGE-XR) 500 MG 24 hr tablet, Take by mouth., Disp: , Rfl:    Multiple Vitamin (MULTIVITAMIN) tablet, Take 1 tablet by mouth daily., Disp: , Rfl:    Semaglutide,0.25 or 0.5MG /DOS, 2 MG/1.5ML SOPN, Inject into the skin. (Patient not taking: Reported on 08/14/2021), Disp: , Rfl:   Current Facility-Administered Medications:    0.9 %  sodium chloride infusion, 500 mL, Intravenous, Once, Mansouraty, Telford Nab., MD No Known Allergies Family History  Problem Relation Age of Onset   Hypertension Mother    Ovarian cancer Maternal Aunt 80   Cancer Maternal Aunt        unknown type   Colon cancer Neg Hx    Colon polyps Neg Hx    Esophageal cancer Neg Hx  Rectal cancer Neg Hx    Stomach cancer Neg Hx    Social History   Socioeconomic History   Marital status: Married    Spouse name: Not on file   Number of children: Not on file   Years of education: Not on file   Highest education level: Not on file  Occupational History   Not on file  Tobacco Use   Smoking status: Never   Smokeless tobacco: Never  Vaping Use   Vaping Use: Never used  Substance and Sexual Activity   Alcohol use:  Not Currently    Alcohol/week: 0.0 standard drinks   Drug use: No   Sexual activity: Yes    Birth control/protection: Surgical    Comment: TUBAL LIGATION  Other Topics Concern   Not on file  Social History Narrative   Not on file   Social Determinants of Health   Financial Resource Strain: Not on file  Food Insecurity: Not on file  Transportation Needs: Not on file  Physical Activity: Not on file  Stress: Not on file  Social Connections: Not on file  Intimate Partner Violence: Not on file    Physical Exam: Today's Vitals   08/14/21 1341 08/14/21 1351  BP: 129/81   Pulse: 98   Temp: (!) 97.5 F (36.4 C) (!) 97.5 F (36.4 C)  SpO2: 100%   Weight: 258 lb (117 kg)   Height: 5\' 6"  (1.676 m)    Body mass index is 41.64 kg/m. GEN: NAD EYE: Sclerae anicteric ENT: MMM CV: Non-tachycardic GI: Soft, NT/ND NEURO:  Alert & Oriented x 3  Lab Results: No results for input(s): WBC, HGB, HCT, PLT in the last 72 hours. BMET No results for input(s): NA, K, CL, CO2, GLUCOSE, BUN, CREATININE, CALCIUM in the last 72 hours. LFT No results for input(s): PROT, ALBUMIN, AST, ALT, ALKPHOS, BILITOT, BILIDIR, IBILI in the last 72 hours. PT/INR No results for input(s): LABPROT, INR in the last 72 hours.   Impression / Plan: This is a 45 y.o.female who presents for colonoscopy for screening.  The risks and benefits of endoscopic evaluation/treatment were discussed with the patient and/or family; these include but are not limited to the risk of perforation, infection, bleeding, missed lesions, lack of diagnosis, severe illness requiring hospitalization, as well as anesthesia and sedation related illnesses.  The patient's history has been reviewed, patient examined, no change in status, and deemed stable for procedure.  The patient and/or family is agreeable to proceed.    Justice Britain, MD [facility] Office # [phone]

## 2021-08-14 NOTE — Progress Notes (Signed)
VS  DT ? ?Pt's states no medical or surgical changes since previsit or office visit. ? ?

## 2021-08-14 NOTE — Progress Notes (Signed)
Called to room to assist during endoscopic procedure.  Patient ID and intended procedure confirmed with present staff. Received instructions for my participation in the procedure from the performing physician.  

## 2021-08-14 NOTE — Op Note (Signed)
[facility] Patient Name: [name] Procedure Date: 08/14/2021 1:59 PM MRN: [id number] Endoscopist: Justice Britain , MD Age: 45 Referring MD:  Date of Birth: 05/02/1976 Gender: Female Account #: [id number] Procedure:                Colonoscopy Indications:              Screening for colorectal malignant neoplasm, This                            is the patient's first colonoscopy Medicines:                Monitored Anesthesia Care Procedure:                Pre-Anesthesia Assessment:                           - Prior to the procedure, a History and Physical                            was performed, and patient medications and                            allergies were reviewed. The patient's tolerance of                            previous anesthesia was also reviewed. The risks                            and benefits of the procedure and the sedation                            options and risks were discussed with the patient.                            All questions were answered, and informed consent                            was obtained. Prior Anticoagulants: The patient has                            taken no previous anticoagulant or antiplatelet                            agents. ASA Grade Assessment: III - A patient with                            severe systemic disease. After reviewing the risks                            and benefits, the patient was deemed in                            satisfactory condition to undergo the procedure.  After obtaining informed consent, the colonoscope                            was passed under direct vision. Throughout the                            procedure, the patient's blood pressure, pulse, and                            oxygen saturations were monitored continuously. The                            Olympus CF-HQ190L ([id number]) Colonoscope was                            introduced through the  anus and advanced to the the                            cecum, identified by appendiceal orifice and                            ileocecal valve. The colonoscopy was performed                            without difficulty. The patient tolerated the                            procedure. The quality of the bowel preparation was                            good. The terminal ileum, ileocecal valve,                            appendiceal orifice, and rectum were photographed. Scope In: 2:28:28 PM Scope Out: 2:48:21 PM Scope Withdrawal Time: 0 hours 16 minutes 32 seconds  Total Procedure Duration: 0 hours 19 minutes 53 seconds  Findings:                 Skin tags were found on perianal exam.                           The digital rectal exam findings include                            hemorrhoids. Pertinent negatives include no                            palpable rectal lesions.                           The terminal ileum and ileocecal valve appeared                            normal.  Five sessile polyps were found in the rectum (1),                            sigmoid colon (2) and descending colon (2). The                            polyps were 2 to 5 mm in size. These polyps were                            removed with a cold snare. Resection and retrieval                            were complete.                           A few small-mouthed diverticula were found in the                            transverse colon, hepatic flexure and ascending                            colon.                           Normal mucosa was found in the entire colon                            otherwise.                           Non-bleeding non-thrombosed external and internal                            hemorrhoids were found during retroflexion, during                            perianal exam and during digital exam. The                            hemorrhoids were Grade II (internal  hemorrhoids                            that prolapse but reduce spontaneously). Complications:            No immediate complications. Estimated Blood Loss:     Estimated blood loss was minimal. Impression:               - Perianal skin tags found on perianal exam.                           - Hemorrhoids found on digital rectal exam.                           - The examined portion of the ileum was normal.                           -  Five 2 to 5 mm polyps in the rectum, in the                            sigmoid colon and in the descending colon, removed                            with a cold snare. Resected and retrieved.                           - Diverticulosis in the transverse colon, at the                            hepatic flexure and in the ascending colon.                           - Normal mucosa in the entire examined colon                            otherwise.                           - Non-bleeding non-thrombosed external and internal                            hemorrhoids. Recommendation:           - The patient will be observed post-procedure,                            until all discharge criteria are met.                           - Discharge patient to home.                           - Patient has a contact number available for                            emergencies. The signs and symptoms of potential                            delayed complications were discussed with the                            patient. Return to normal activities tomorrow.                            Written discharge instructions were provided to the                            patient.                           - High fiber diet.                           -  Use FiberCon 1-2 tablets PO daily.                           - Continue present medications.                           - Await pathology results.                           - Repeat colonoscopy in 3/12/21/08 years for                             surveillance based on pathology results and findgs                            of adenomatous tissue.                           - The findings and recommendations were discussed                            with the patient.                           - The findings and recommendations were discussed                            with the patient's family. Justice Britain, MD 08/14/2021 2:58:33 PM

## 2021-08-14 NOTE — Progress Notes (Signed)
Report to PACU, RN, vss, BBS= Clear.  

## 2021-08-14 NOTE — Patient Instructions (Signed)
Handouts on polyps, diverticulosis and hemorrhoids given to you today  High fiber diet or 1-2 fibercon tablets daily recommended to keep bowel movements regular   YOU HAD AN ENDOSCOPIC PROCEDURE TODAY AT [facility]:   Refer to the procedure report that was given to you for any specific questions about what was found during the examination.  If the procedure report does not answer your questions, please call your gastroenterologist to clarify.  If you requested that your care partner not be given the details of your procedure findings, then the procedure report has been included in a sealed envelope for you to review at your convenience later.  YOU SHOULD EXPECT: Some feelings of bloating in the abdomen. Passage of more gas than usual.  Walking can help get rid of the air that was put into your GI tract during the procedure and reduce the bloating. If you had a lower endoscopy (such as a colonoscopy or flexible sigmoidoscopy) you may notice spotting of blood in your stool or on the toilet paper. If you underwent a bowel prep for your procedure, you may not have a normal bowel movement for a few days.  Please Note:  You might notice some irritation and congestion in your nose or some drainage.  This is from the oxygen used during your procedure.  There is no need for concern and it should clear up in a day or so.  SYMPTOMS TO REPORT IMMEDIATELY:  Following lower endoscopy (colonoscopy or flexible sigmoidoscopy):  Excessive amounts of blood in the stool  Significant tenderness or worsening of abdominal pains  Swelling of the abdomen that is new, acute  Fever of 100F or higher  For urgent or emergent issues, a gastroenterologist can be reached at any hour by calling [phone]. Do not use MyChart messaging for urgent concerns.    DIET:  We do recommend a small meal at first, but then you may proceed to your regular diet.  Drink plenty of fluids but you should avoid alcoholic  beverages for 24 hours.  ACTIVITY:  You should plan to take it easy for the rest of today and you should NOT DRIVE or use heavy machinery until tomorrow (because of the sedation medicines used during the test).    FOLLOW UP: Our staff will call the number listed on your records 48-72 hours following your procedure to check on you and address any questions or concerns that you may have regarding the information given to you following your procedure. If we do not reach you, we will leave a message.  We will attempt to reach you two times.  During this call, we will ask if you have developed any symptoms of COVID 19. If you develop any symptoms (ie: fever, flu-like symptoms, shortness of breath, cough etc.) before then, please call [phone].  If you test positive for Covid 19 in the 2 weeks post procedure, please call and report this information to [place].    If any biopsies were taken you will be contacted by phone or by letter within the next 1-3 weeks.  Please call us at [phone] if you have not heard about the biopsies in 3 weeks.    SIGNATURES/CONFIDENTIALITY: You and/or your care partner have signed paperwork which will be entered into your electronic medical record.  These signatures attest to the fact that that the information above on your After Visit Summary has been reviewed and is understood.  Full responsibility of the confidentiality of this discharge  information lies with you and/or your care-partner.

## 2021-08-20 ENCOUNTER — Telehealth: Payer: Self-pay | Admitting: *Deleted

## 2021-08-20 NOTE — Telephone Encounter (Signed)
°  Follow up Call-  Call back number 08/14/2021  Post procedure Call Back phone  # [phone]  Permission to leave phone message Yes  Some recent data might be hidden     Patient questions:  Do you have a fever, pain , or abdominal swelling? No. Pain Score  0 *  Have you tolerated food without any problems? Yes.    Have you been able to return to your normal activities? Yes.    Do you have any questions about your discharge instructions: Diet   No. Medications  No. Follow up visit  No.  Do you have questions or concerns about your Care? No.  Actions: * If pain score is 4 or above: No action needed, pain <4.  Have you developed a fever since your procedure? no  2.   Have you had an respiratory symptoms (SOB or cough) since your procedure? no  3.   Have you tested positive for COVID 19 since your procedure no  4.   Have you had any family members/close contacts diagnosed with the COVID 19 since your procedure?  no   If yes to any of these questions please route to Joylene John, RN and Joella Prince, RN

## 2021-08-20 NOTE — Telephone Encounter (Signed)
°  Follow up Call-  Call back number 08/14/2021  Post procedure Call Back phone  # [phone]  Permission to leave phone message Yes  Some recent data might be hidden     Patient questions: Message left to call us if necessary.

## 2021-08-21 ENCOUNTER — Encounter: Payer: Self-pay | Admitting: Gastroenterology
